# Patient Record
Sex: Male | Born: 1960 | Race: White | Hispanic: No | Marital: Married | Smoking: Never smoker
Health system: Southern US, Community
[De-identification: ages and names within clinical notes are randomized; demographics above are authoritative.]

## PROBLEM LIST (undated history)

## (undated) DIAGNOSIS — K659 Peritonitis, unspecified: Secondary | ICD-10-CM

## (undated) DIAGNOSIS — M549 Dorsalgia, unspecified: Secondary | ICD-10-CM

## (undated) DIAGNOSIS — Q613 Polycystic kidney, unspecified: Secondary | ICD-10-CM

## (undated) DIAGNOSIS — G8929 Other chronic pain: Secondary | ICD-10-CM

## (undated) DIAGNOSIS — N200 Calculus of kidney: Secondary | ICD-10-CM

## (undated) DIAGNOSIS — E785 Hyperlipidemia, unspecified: Secondary | ICD-10-CM

## (undated) DIAGNOSIS — M1710 Unilateral primary osteoarthritis, unspecified knee: Secondary | ICD-10-CM

## (undated) DIAGNOSIS — M51369 Other intervertebral disc degeneration, lumbar region without mention of lumbar back pain or lower extremity pain: Secondary | ICD-10-CM

## (undated) DIAGNOSIS — Z992 Dependence on renal dialysis: Secondary | ICD-10-CM

## (undated) DIAGNOSIS — E119 Type 2 diabetes mellitus without complications: Secondary | ICD-10-CM

## (undated) DIAGNOSIS — M79605 Pain in left leg: Secondary | ICD-10-CM

## (undated) DIAGNOSIS — G4733 Obstructive sleep apnea (adult) (pediatric): Secondary | ICD-10-CM

## (undated) DIAGNOSIS — M79604 Pain in right leg: Secondary | ICD-10-CM

## (undated) DIAGNOSIS — M109 Gout, unspecified: Secondary | ICD-10-CM

## (undated) DIAGNOSIS — N186 End stage renal disease: Secondary | ICD-10-CM

## (undated) DIAGNOSIS — M5136 Other intervertebral disc degeneration, lumbar region: Secondary | ICD-10-CM

## (undated) DIAGNOSIS — G629 Polyneuropathy, unspecified: Secondary | ICD-10-CM

## (undated) DIAGNOSIS — M179 Osteoarthritis of knee, unspecified: Secondary | ICD-10-CM

## (undated) DIAGNOSIS — Z9989 Dependence on other enabling machines and devices: Secondary | ICD-10-CM

---

## 2016-02-09 ENCOUNTER — Ambulatory Visit (HOSPITAL_COMMUNITY)
Admission: RE | Admit: 2016-02-09 | Discharge: 2016-02-09 | Disposition: A | Discharge status: Home / Self Care | Payer: Medicare Other | Source: Ambulatory Visit | Attending: Internal Medicine | Admitting: Internal Medicine

## 2016-02-09 ENCOUNTER — Other Ambulatory Visit (HOSPITAL_COMMUNITY): Payer: Self-pay

## 2016-02-09 ENCOUNTER — Inpatient Hospital Stay
Admission: RE | Admit: 2016-02-09 | Discharge: 2016-02-15 | Disposition: A | Discharge status: Other Acute Inpatient Hospital | Payer: Self-pay | Source: Ambulatory Visit | Attending: Internal Medicine | Admitting: Internal Medicine

## 2016-02-09 ENCOUNTER — Ambulatory Visit (HOSPITAL_COMMUNITY)
Admission: AD | Admit: 2016-02-09 | Payer: Medicare Other | Source: Other Acute Inpatient Hospital | Admitting: Internal Medicine

## 2016-02-09 DIAGNOSIS — Z4659 Encounter for fitting and adjustment of other gastrointestinal appliance and device: Secondary | ICD-10-CM

## 2016-02-09 DIAGNOSIS — J9690 Respiratory failure, unspecified, unspecified whether with hypoxia or hypercapnia: Secondary | ICD-10-CM | POA: Insufficient documentation

## 2016-02-09 LAB — T4, FREE: Free T4: 0.88 ng/dL (ref 0.61–1.12)

## 2016-02-09 LAB — TSH: TSH: 3.039 u[IU]/mL (ref 0.350–4.500)

## 2016-02-10 ENCOUNTER — Encounter (HOSPITAL_BASED_OUTPATIENT_CLINIC_OR_DEPARTMENT_OTHER): Payer: Self-pay

## 2016-02-10 DIAGNOSIS — R609 Edema, unspecified: Secondary | ICD-10-CM

## 2016-02-10 LAB — HEMOGLOBIN A1C
HEMOGLOBIN A1C: 6.8 % — AB (ref 4.8–5.6)
Hgb A1c MFr Bld: 6.7 % — ABNORMAL HIGH (ref 4.8–5.6)
MEAN PLASMA GLUCOSE: 148 mg/dL
Mean Plasma Glucose: 146 mg/dL

## 2016-02-10 LAB — CLOSTRIDIUM DIFFICILE BY PCR: CDIFFPCR: POSITIVE — AB

## 2016-02-10 LAB — COMPREHENSIVE METABOLIC PANEL
ALT: 9 U/L — AB (ref 17–63)
ANION GAP: 8 (ref 5–15)
AST: 33 U/L (ref 15–41)
Albumin: 2 g/dL — ABNORMAL LOW (ref 3.5–5.0)
Alkaline Phosphatase: 77 U/L (ref 38–126)
BUN: 51 mg/dL — ABNORMAL HIGH (ref 6–20)
CHLORIDE: 99 mmol/L — AB (ref 101–111)
CO2: 29 mmol/L (ref 22–32)
Calcium: 9.3 mg/dL (ref 8.9–10.3)
Creatinine, Ser: 5.48 mg/dL — ABNORMAL HIGH (ref 0.61–1.24)
GFR calc non Af Amer: 11 mL/min — ABNORMAL LOW (ref >60)
GFR, EST AFRICAN AMERICAN: 12 mL/min — AB (ref >60)
Glucose, Bld: 126 mg/dL — ABNORMAL HIGH (ref 65–99)
POTASSIUM: 4.1 mmol/L (ref 3.5–5.1)
SODIUM: 136 mmol/L (ref 135–145)
Total Bilirubin: 1.3 mg/dL — ABNORMAL HIGH (ref 0.3–1.2)
Total Protein: 5.4 g/dL — ABNORMAL LOW (ref 6.5–8.1)

## 2016-02-10 LAB — PHOSPHORUS: PHOSPHORUS: 6.5 mg/dL — AB (ref 2.5–4.6)

## 2016-02-10 LAB — T4, FREE: Free T4: 0.93 ng/dL (ref 0.61–1.12)

## 2016-02-10 LAB — CBC WITH DIFFERENTIAL/PLATELET
Basophils Absolute: 0 10*3/uL (ref 0.0–0.1)
Basophils Relative: 0 %
Eosinophils Absolute: 0.2 10*3/uL (ref 0.0–0.7)
Eosinophils Relative: 3 %
HEMATOCRIT: 24.4 % — AB (ref 39.0–52.0)
HEMOGLOBIN: 7.4 g/dL — AB (ref 13.0–17.0)
LYMPHS ABS: 0.6 10*3/uL — AB (ref 0.7–4.0)
Lymphocytes Relative: 9 %
MCH: 26.6 pg (ref 26.0–34.0)
MCHC: 30.3 g/dL (ref 30.0–36.0)
MCV: 87.8 fL (ref 78.0–100.0)
MONOS PCT: 11 %
Monocytes Absolute: 0.8 10*3/uL (ref 0.1–1.0)
NEUTROS ABS: 5.2 10*3/uL (ref 1.7–7.7)
NEUTROS PCT: 77 %
Platelets: 276 10*3/uL (ref 150–400)
RBC: 2.78 MIL/uL — AB (ref 4.22–5.81)
RDW: 15.5 % (ref 11.5–15.5)
WBC: 6.8 10*3/uL (ref 4.0–10.5)

## 2016-02-10 LAB — IRON AND TIBC
Iron: 35 ug/dL — ABNORMAL LOW (ref 45–182)
SATURATION RATIOS: 22 % (ref 17.9–39.5)
TIBC: 158 ug/dL — AB (ref 250–450)
UIBC: 123 ug/dL

## 2016-02-10 LAB — PROTIME-INR
INR: 1.18
Prothrombin Time: 15.1 seconds (ref 11.4–15.2)

## 2016-02-10 LAB — C DIFFICILE QUICK SCREEN W PCR REFLEX
C DIFFICILE (CDIFF) TOXIN: NEGATIVE
C Diff antigen: POSITIVE — AB

## 2016-02-10 LAB — PROCALCITONIN: PROCALCITONIN: 1.13 ng/mL

## 2016-02-10 LAB — TRANSFERRIN: TRANSFERRIN: 113 mg/dL — AB (ref 180–329)

## 2016-02-10 LAB — TSH: TSH: 3.388 u[IU]/mL (ref 0.350–4.500)

## 2016-02-10 LAB — FERRITIN: Ferritin: 599 ng/mL — ABNORMAL HIGH (ref 24–336)

## 2016-02-10 LAB — MAGNESIUM: Magnesium: 2.5 mg/dL — ABNORMAL HIGH (ref 1.7–2.4)

## 2016-02-10 NOTE — Progress Notes (Signed)
*  PRELIMINARY RESULTS* Vascular Ultrasound Right upper extremity venous duplex has been completed.  Preliminary findings: no evidence of DVT or superficial thrombosis. Thickening vs. Partial superficial thrombus noted in the right cephalic vein, which may be related to AVF.    Farrel DemarkJill Eunice, RDMS, RVT  02/10/2016, 10:11 AM

## 2016-02-10 NOTE — Consult Note (Signed)
Date: 02/10/2016                  Patient Name:  KALETH KOY  MRN: [id number]  DOB: 10-05-1960  Age / Sex: 55 y.o., male         PCP: No primary care provider on file.                 Service Requesting Consult: Internal medicine                 Reason for Consult: ESRD, arrange HD            History of Present Illness: Patient is a 55 y.o. male with medical problems of end-stage renal disease started hemodialysis 2015, then switch to peritoneal dialysis in July 2016, diabetes, hyperlipidemia, gouty arthritis, polycystic kidney disease, hypertension, obstructive sleep apnea, chronic back pain from arthritis, who [facility] . patient presented to hospital after a fall at home and hematuria. CT revealed acute subdural hemorrhage without brain compression. His hospital stay was complicated by cardiac arrest, requiring CPR, acute resp failure requiring intubation, aspiration pneumonia. Patient had hip fractures from CPR. Currently he is getting treatment for aspiration pneumonia and MSSA. All information is obtained from the chart as well as from patient wife. Patient is lethargic and unable to provide detailed medical information to Nephrology consult requested for evaluation and management of ESRD   Medications: Outpatient medications: No prescriptions prior to admission.    Current medications: No current facility-administered medications for this encounter.       Allergies: Allergies not on file    Past Medical History: No past medical history on file.   Past Surgical History: No past surgical history on file.  Patient's wife's name is Mykel Sponaugle. He used to be a Lobbyist. Prior to admission patient was independent in activities of daily living. He wasn't to drive. His wife reports he didn't have back pain after walking certain distance.  Family History: No family history on file.   Social  History: Social History   Social History  . Marital status: Married    Spouse name: N/A  . Number of children: N/A  . Years of education: N/A   Occupational History  . Not on file.   Social History Main Topics  . Smoking status: Not on file  . Smokeless tobacco: Not on file  . Alcohol use Not on file  . Drug use: Unknown  . Sexual activity: Not on file   Other Topics Concern  . Not on file   Social History Narrative  . No narrative on file     Review of Systems: not available as patient is lethargic Gen:  HEENT:  CV:  Resp:  GI: GU :  MS:  Derm:   Psych: Heme:  Neuro:  Endocrine  Vital Signs: There were no vitals taken for this visit.   Temperature 98.1, pulse 86, respirations 22, blood pressure 146/80  No intake or output data in the 24 hours ending 02/10/16 1620  Weight trends: There were no vitals filed for this visit.  Physical Exam: General:  No acute distress  HEENT NG tube, moist oral mucous membranes  Neck:  supple  Lungs: Normal breathing effort, scattered rhonchi  Heart::  tachycardic  Abdomen: Soft, nontender, mildly distended, condom catheter, peritoneal dialysis catheter in place  Extremities:  both feet are in soft boot support  Neurologic: Lethargic but arousable, answers simple yes/no  questions  Skin: No acute rashes  Access: Right upper arm AV fistula  Foley: Condom catheter       Lab results: Basic Metabolic Panel:  Recent Labs Lab 02/10/16 0500  NA 136  K 4.1  CL 99*  CO2 29  GLUCOSE 126*  BUN 51*  CREATININE 5.48*  CALCIUM 9.3  MG 2.5*  PHOS 6.5*    Liver Function Tests:  Recent Labs Lab 02/10/16 0500  AST 33  ALT 9*  ALKPHOS 77  BILITOT 1.3*  PROT 5.4*  ALBUMIN 2.0*   No results for input(s): LIPASE, AMYLASE in the last 168 hours. No results for input(s): AMMONIA in the last 168 hours.  CBC:  Recent Labs Lab 02/10/16 0500  WBC 6.8  NEUTROABS 5.2  HGB 7.4*  HCT 24.4*  MCV 87.8  PLT 276     Cardiac Enzymes: No results for input(s): CKTOTAL, TROPONINI in the last 168 hours.  BNP: Invalid input(s): POCBNP  CBG: No results for input(s): GLUCAP in the last 168 hours.  Microbiology: Recent Results (from the past 720 hour(s))  C difficile quick scan w PCR reflex     Status: Abnormal   Collection Time: 02/10/16 10:13 AM  Result Value Ref Range Status   C Diff antigen POSITIVE (A) NEGATIVE Final   C Diff toxin NEGATIVE NEGATIVE Final   C Diff interpretation Results are indeterminate. See PCR results.  Final  Clostridium Difficile by PCR     Status: Abnormal   Collection Time: 02/10/16 10:13 AM  Result Value Ref Range Status   Toxigenic C Difficile by pcr POSITIVE (A) NEGATIVE Final    Comment: Positive for toxigenic C. difficile with little to no toxin production. Only treat if clinical presentation suggests symptomatic illness.     Coagulation Studies:  Recent Labs  02/10/16 0500  LABPROT 15.1  INR 1.18    Urinalysis: No results for input(s): COLORURINE, LABSPEC, PHURINE, GLUCOSEU, HGBUR, BILIRUBINUR, KETONESUR, PROTEINUR, UROBILINOGEN, NITRITE, LEUKOCYTESUR in the last 72 hours.  Invalid input(s): APPERANCEUR      Imaging: Dg Abd 1 View  Result Date: 02/09/2016 CLINICAL DATA:  Nasogastric tube placement. Respiratory failure. Initial encounter. EXAM: ABDOMEN - 1 VIEW COMPARISON:  None. FINDINGS: The patient's enteric tube is noted ending overlying the antrum of the stomach. The stomach is partially filled with air. The visualized bowel gas pattern is unremarkable. Scattered air and stool filled loops of colon are seen; no abnormal dilatation of small bowel loops is seen to suggest small bowel obstruction. No free intra-abdominal air is identified, though evaluation for free air is limited on a single supine view. The visualized osseous structures are within normal limits; the sacroiliac joints are unremarkable in appearance. IMPRESSION: Enteric tube noted  ending overlying the antrum of the stomach. Electronically Signed   By: Roanna Raider M.D.   On: 02/09/2016 17:44   Dg Chest Port 1 View  Result Date: 02/09/2016 CLINICAL DATA:  Nasogastric placement. EXAM: PORTABLE CHEST 1 VIEW COMPARISON:  None. FINDINGS: Nasogastric tube enters the abdomen. Right internal jugular central line has its tip in the SVC at the azygos level. No pneumothorax. There is pulmonary infiltrate at the lung bases right more than left consistent with pneumonia. No effusion. IMPRESSION: Lower lung pneumonia right more than left. Nasogastric tube enters the abdomen. Right IJ central line in the SVC at the azygos level. Electronically Signed   By: Paulina Fusi M.D.   On: 02/09/2016 17:44      Assessment & Plan:  Pt is a 55 y.o. Caucasian male with  medical problems of end-stage renal disease started hemodialysis 2015, then switch to peritoneal dialysis in July 2016, diabetes, hyperlipidemia, gouty arthritis, polycystic kidney disease, hypertension, obstructive sleep apnea, chronic back pain from arthritis, who [facility]   1. End-stage renal disease We will place him on Monday, Wednesday, Friday schedule Dialysis today  2. Anemia of chronic kidney disease Current hemoglobin 7.4 We'll place him on Aranesp protocol Check iron studies   3. Secondary hyperparathyroidism Monitor phosphorus during hospital stay Currently 6.5 We will start him on binders once he is able to take adequate oral intake  4. MSSA infection Antibiotics as per primary team

## 2016-02-11 LAB — RENAL FUNCTION PANEL
ALBUMIN: 2.1 g/dL — AB (ref 3.5–5.0)
Anion gap: 9 (ref 5–15)
BUN: 44 mg/dL — ABNORMAL HIGH (ref 6–20)
CALCIUM: 9 mg/dL (ref 8.9–10.3)
CO2: 29 mmol/L (ref 22–32)
CREATININE: 5.03 mg/dL — AB (ref 0.61–1.24)
Chloride: 97 mmol/L — ABNORMAL LOW (ref 101–111)
GFR calc Af Amer: 14 mL/min — ABNORMAL LOW (ref >60)
GFR calc non Af Amer: 12 mL/min — ABNORMAL LOW (ref >60)
GLUCOSE: 121 mg/dL — AB (ref 65–99)
PHOSPHORUS: 5.7 mg/dL — AB (ref 2.5–4.6)
Potassium: 4.2 mmol/L (ref 3.5–5.1)
SODIUM: 135 mmol/L (ref 135–145)

## 2016-02-11 LAB — CBC WITH DIFFERENTIAL/PLATELET
BASOS ABS: 0 10*3/uL (ref 0.0–0.1)
BASOS PCT: 0 %
EOS ABS: 0.2 10*3/uL (ref 0.0–0.7)
Eosinophils Relative: 4 %
HEMATOCRIT: 24 % — AB (ref 39.0–52.0)
HEMOGLOBIN: 7.5 g/dL — AB (ref 13.0–17.0)
Lymphocytes Relative: 13 %
Lymphs Abs: 0.9 10*3/uL (ref 0.7–4.0)
MCH: 27.4 pg (ref 26.0–34.0)
MCHC: 31.3 g/dL (ref 30.0–36.0)
MCV: 87.6 fL (ref 78.0–100.0)
MONO ABS: 0.5 10*3/uL (ref 0.1–1.0)
MONOS PCT: 7 %
NEUTROS ABS: 5.1 10*3/uL (ref 1.7–7.7)
NEUTROS PCT: 76 %
Platelets: 274 10*3/uL (ref 150–400)
RBC: 2.74 MIL/uL — ABNORMAL LOW (ref 4.22–5.81)
RDW: 15.5 % (ref 11.5–15.5)
WBC: 6.6 10*3/uL (ref 4.0–10.5)

## 2016-02-11 LAB — HEPATITIS B CORE ANTIBODY, TOTAL: Hep B Core Total Ab: NEGATIVE

## 2016-02-11 LAB — HEPATITIS B SURFACE ANTIGEN: HEP B S AG: NEGATIVE

## 2016-02-11 LAB — PTH, INTACT AND CALCIUM
Calcium, Total (PTH): 8.9 mg/dL (ref 8.7–10.2)
PTH: 134 pg/mL — AB (ref 15–65)

## 2016-02-11 LAB — HEPATITIS B SURFACE ANTIBODY,QUALITATIVE: Hep B S Ab: NONREACTIVE

## 2016-02-11 LAB — MAGNESIUM: MAGNESIUM: 2.4 mg/dL (ref 1.7–2.4)

## 2016-02-12 LAB — RENAL FUNCTION PANEL
ALBUMIN: 2 g/dL — AB (ref 3.5–5.0)
Anion gap: 11 (ref 5–15)
BUN: 63 mg/dL — AB (ref 6–20)
CO2: 29 mmol/L (ref 22–32)
Calcium: 9.5 mg/dL (ref 8.9–10.3)
Chloride: 99 mmol/L — ABNORMAL LOW (ref 101–111)
Creatinine, Ser: 6.98 mg/dL — ABNORMAL HIGH (ref 0.61–1.24)
GFR calc Af Amer: 9 mL/min — ABNORMAL LOW (ref >60)
GFR calc non Af Amer: 8 mL/min — ABNORMAL LOW (ref >60)
GLUCOSE: 58 mg/dL — AB (ref 65–99)
PHOSPHORUS: 8.2 mg/dL — AB (ref 2.5–4.6)
POTASSIUM: 4.8 mmol/L (ref 3.5–5.1)
Sodium: 139 mmol/L (ref 135–145)

## 2016-02-12 LAB — CBC WITH DIFFERENTIAL/PLATELET
BASOS ABS: 0 10*3/uL (ref 0.0–0.1)
BASOS PCT: 0 %
EOS ABS: 0.3 10*3/uL (ref 0.0–0.7)
EOS PCT: 4 %
HCT: 24.1 % — ABNORMAL LOW (ref 39.0–52.0)
Hemoglobin: 7.2 g/dL — ABNORMAL LOW (ref 13.0–17.0)
Lymphocytes Relative: 11 %
Lymphs Abs: 0.8 10*3/uL (ref 0.7–4.0)
MCH: 26.6 pg (ref 26.0–34.0)
MCHC: 29.9 g/dL — ABNORMAL LOW (ref 30.0–36.0)
MCV: 88.9 fL (ref 78.0–100.0)
MONO ABS: 0.7 10*3/uL (ref 0.1–1.0)
Monocytes Relative: 10 %
Neutro Abs: 5.5 10*3/uL (ref 1.7–7.7)
Neutrophils Relative %: 75 %
PLATELETS: 300 10*3/uL (ref 150–400)
RBC: 2.71 MIL/uL — ABNORMAL LOW (ref 4.22–5.81)
RDW: 15.7 % — AB (ref 11.5–15.5)
WBC: 7.3 10*3/uL (ref 4.0–10.5)

## 2016-02-12 LAB — MAGNESIUM: MAGNESIUM: 2.6 mg/dL — AB (ref 1.7–2.4)

## 2016-02-13 LAB — CBC
HCT: 24.1 % — ABNORMAL LOW (ref 39.0–52.0)
HEMOGLOBIN: 7.3 g/dL — AB (ref 13.0–17.0)
MCH: 26.8 pg (ref 26.0–34.0)
MCHC: 30.3 g/dL (ref 30.0–36.0)
MCV: 88.6 fL (ref 78.0–100.0)
Platelets: 305 10*3/uL (ref 150–400)
RBC: 2.72 MIL/uL — ABNORMAL LOW (ref 4.22–5.81)
RDW: 15.8 % — AB (ref 11.5–15.5)
WBC: 8 10*3/uL (ref 4.0–10.5)

## 2016-02-13 LAB — RENAL FUNCTION PANEL
ALBUMIN: 2.1 g/dL — AB (ref 3.5–5.0)
ANION GAP: 14 (ref 5–15)
BUN: 80 mg/dL — AB (ref 6–20)
CALCIUM: 9.3 mg/dL (ref 8.9–10.3)
CO2: 26 mmol/L (ref 22–32)
Chloride: 96 mmol/L — ABNORMAL LOW (ref 101–111)
Creatinine, Ser: 8.59 mg/dL — ABNORMAL HIGH (ref 0.61–1.24)
GFR calc Af Amer: 7 mL/min — ABNORMAL LOW (ref >60)
GFR, EST NON AFRICAN AMERICAN: 6 mL/min — AB (ref >60)
GLUCOSE: 135 mg/dL — AB (ref 65–99)
PHOSPHORUS: 10.5 mg/dL — AB (ref 2.5–4.6)
Potassium: 6.1 mmol/L — ABNORMAL HIGH (ref 3.5–5.1)
SODIUM: 136 mmol/L (ref 135–145)

## 2016-02-13 LAB — POTASSIUM: Potassium: 4 mmol/L (ref 3.5–5.1)

## 2016-02-13 NOTE — Progress Notes (Signed)
Subjective:   Patient seen during dialysis Tolerating well  + C Diff - stool Phos and potassium are high V8 juice in the room.  Low grade fever- blood cultures to be drawn during dialysis   Objective:  Vital signs in last 24 hours:  BP: ()/()  Arterial Line BP: ()/()   Weight change:  There were no vitals filed for this visit.  Intake/Output:   No intake or output data in the 24 hours ending 02/13/16 0959   Physical Exam: General: NAD, laying in the bed  HEENT Anicteric  Neck supple  Pulm/lungs Clear b/l  CVS/Heart No rub  Abdomen:  Soft, mild tenderness, PD cathter in place  Extremities: No peripheral edema  Neurologic: Alert, oriented  Skin: No acute rashes  Access: Rt upper arm AVF       Basic Metabolic Panel:   Recent Labs Lab 02/10/16 0500  02/11/16 0600 02/12/16 0637 02/13/16 0500  NA 136  --  135 139 136  K 4.1  --  4.2 4.8 6.1*  CL 99*  --  97* 99* 96*  CO2 29  --  29 29 26   GLUCOSE 126*  --  121* 58* 135*  BUN 51*  --  44* 63* 80*  CREATININE 5.48*  --  5.03* 6.98* 8.59*  CALCIUM 9.3  < > 9.0 9.5 9.3  MG 2.5*  --  2.4 2.6*  --   PHOS 6.5*  --  5.7* 8.2* 10.5*  < > = values in this interval not displayed.   CBC:  Recent Labs Lab 02/10/16 0500 02/11/16 0600 02/12/16 0637 02/13/16 0500  WBC 6.8 6.6 7.3 8.0  NEUTROABS 5.2 5.1 5.5  --   HGB 7.4* 7.5* 7.2* 7.3*  HCT 24.4* 24.0* 24.1* 24.1*  MCV 87.8 87.6 88.9 88.6  PLT 276 274 300 305      Microbiology:  Recent Results (from the past 720 hour(s))  C difficile quick scan w PCR reflex     Status: Abnormal   Collection Time: 02/10/16 10:13 AM  Result Value Ref Range Status   C Diff antigen POSITIVE (A) NEGATIVE Final   C Diff toxin NEGATIVE NEGATIVE Final   C Diff interpretation Results are indeterminate. See PCR results.  Final  Clostridium Difficile by PCR     Status: Abnormal   Collection Time: 02/10/16 10:13 AM  Result Value Ref Range Status   Toxigenic C Difficile by pcr  POSITIVE (A) NEGATIVE Final    Comment: Positive for toxigenic C. difficile with little to no toxin production. Only treat if clinical presentation suggests symptomatic illness.    Coagulation Studies: No results for input(s): LABPROT, INR in the last 72 hours.  Urinalysis: No results for input(s): COLORURINE, LABSPEC, PHURINE, GLUCOSEU, HGBUR, BILIRUBINUR, KETONESUR, PROTEINUR, UROBILINOGEN, NITRITE, LEUKOCYTESUR in the last 72 hours.  Invalid input(s): APPERANCEUR    Imaging: No results found.   Medications:       Assessment/ Plan:  55 y.o. caucasian male with  medical problems of end-stage renal disease started hemodialysis 2015, then switch to peritoneal dialysis in July 2016, diabetes, hyperlipidemia, gouty arthritis, polycystic kidney disease, hypertension, obstructive sleep apnea, chronic back pain from arthritis, who [facility]   1. End-stage renal disease We will place him on Monday, Wednesday, Friday schedule Dialysis today Patient seen during dialysis Tolerating well   2. Anemia of chronic kidney disease Current hemoglobin 7.3 T sat 22% Continue Aranesp   3. Secondary hyperparathyroidism Monitor phosphorus  during hospital stay Currently 10.5 Continue renvela  4. MSSA infection Antibiotics as per primary team  5. C Diff positive ABx per IM team   LOS: 0 Wednesday Ericsson 10/23/20179:59 AM

## 2016-02-14 LAB — RENAL FUNCTION PANEL
ALBUMIN: 1.9 g/dL — AB (ref 3.5–5.0)
ANION GAP: 10 (ref 5–15)
BUN: 51 mg/dL — ABNORMAL HIGH (ref 6–20)
CALCIUM: 8.9 mg/dL (ref 8.9–10.3)
CO2: 29 mmol/L (ref 22–32)
Chloride: 97 mmol/L — ABNORMAL LOW (ref 101–111)
Creatinine, Ser: 6.6 mg/dL — ABNORMAL HIGH (ref 0.61–1.24)
GFR calc non Af Amer: 8 mL/min — ABNORMAL LOW (ref >60)
GFR, EST AFRICAN AMERICAN: 10 mL/min — AB (ref >60)
Glucose, Bld: 62 mg/dL — ABNORMAL LOW (ref 65–99)
PHOSPHORUS: 7.9 mg/dL — AB (ref 2.5–4.6)
Potassium: 5.2 mmol/L — ABNORMAL HIGH (ref 3.5–5.1)
SODIUM: 136 mmol/L (ref 135–145)

## 2016-02-14 LAB — PREPARE RBC (CROSSMATCH)

## 2016-02-14 LAB — CBC WITH DIFFERENTIAL/PLATELET
BASOS ABS: 0 10*3/uL (ref 0.0–0.1)
BASOS PCT: 0 %
Eosinophils Absolute: 0.2 10*3/uL (ref 0.0–0.7)
Eosinophils Relative: 3 %
HEMATOCRIT: 21.4 % — AB (ref 39.0–52.0)
HEMOGLOBIN: 6.5 g/dL — AB (ref 13.0–17.0)
Lymphocytes Relative: 13 %
Lymphs Abs: 0.7 10*3/uL (ref 0.7–4.0)
MCH: 27.4 pg (ref 26.0–34.0)
MCHC: 30.4 g/dL (ref 30.0–36.0)
MCV: 90.3 fL (ref 78.0–100.0)
Monocytes Absolute: 0.6 10*3/uL (ref 0.1–1.0)
Monocytes Relative: 10 %
NEUTROS ABS: 4.2 10*3/uL (ref 1.7–7.7)
NEUTROS PCT: 74 %
Platelets: 253 10*3/uL (ref 150–400)
RBC: 2.37 MIL/uL — ABNORMAL LOW (ref 4.22–5.81)
RDW: 16.1 % — ABNORMAL HIGH (ref 11.5–15.5)
WBC: 5.6 10*3/uL (ref 4.0–10.5)

## 2016-02-14 LAB — ABO/RH: ABO/RH(D): O POS

## 2016-02-14 LAB — MAGNESIUM: Magnesium: 2.3 mg/dL (ref 1.7–2.4)

## 2016-02-14 MED FILL — Fentanyl Citrate Preservative Free (PF) Inj 100 MCG/2ML: INTRAMUSCULAR | Qty: 2 | Status: AC

## 2016-02-15 ENCOUNTER — Inpatient Hospital Stay
Admission: RE | Admit: 2016-02-15 | Discharge: 2016-02-29 | Disposition: A | Discharge status: Rehabilitation Facility | Payer: Medicare Other | Source: Ambulatory Visit | Attending: Internal Medicine | Admitting: Internal Medicine

## 2016-02-15 ENCOUNTER — Encounter
Admission: RE | Disposition: A | Discharge status: Other Acute Inpatient Hospital | Payer: Self-pay | Source: Ambulatory Visit | Attending: Internal Medicine

## 2016-02-15 ENCOUNTER — Ambulatory Visit (HOSPITAL_COMMUNITY)
Admission: RE | Admit: 2016-02-15 | Discharge: 2016-02-15 | Disposition: A | Discharge status: Home / Self Care | Payer: No Typology Code available for payment source | Source: Ambulatory Visit | Attending: Vascular Surgery | Admitting: Vascular Surgery

## 2016-02-15 ENCOUNTER — Encounter (HOSPITAL_COMMUNITY)
Admission: RE | Disposition: A | Discharge status: Other Acute Inpatient Hospital | Payer: Self-pay | Source: Ambulatory Visit | Attending: Vascular Surgery

## 2016-02-15 ENCOUNTER — Encounter (HOSPITAL_COMMUNITY): Payer: Self-pay | Admitting: Vascular Surgery

## 2016-02-15 DIAGNOSIS — N186 End stage renal disease: Secondary | ICD-10-CM | POA: Insufficient documentation

## 2016-02-15 DIAGNOSIS — T82858A Stenosis of vascular prosthetic devices, implants and grafts, initial encounter: Secondary | ICD-10-CM | POA: Insufficient documentation

## 2016-02-15 DIAGNOSIS — Z91041 Radiographic dye allergy status: Secondary | ICD-10-CM | POA: Insufficient documentation

## 2016-02-15 DIAGNOSIS — Y832 Surgical operation with anastomosis, bypass or graft as the cause of abnormal reaction of the patient, or of later complication, without mention of misadventure at the time of the procedure: Secondary | ICD-10-CM | POA: Insufficient documentation

## 2016-02-15 DIAGNOSIS — T82898A Other specified complication of vascular prosthetic devices, implants and grafts, initial encounter: Secondary | ICD-10-CM | POA: Diagnosis not present

## 2016-02-15 DIAGNOSIS — Z992 Dependence on renal dialysis: Secondary | ICD-10-CM | POA: Insufficient documentation

## 2016-02-15 HISTORY — DX: End stage renal disease: N18.6

## 2016-02-15 HISTORY — DX: Osteoarthritis of knee, unspecified: M17.9

## 2016-02-15 HISTORY — DX: Calculus of kidney: N20.0

## 2016-02-15 HISTORY — DX: Type 2 diabetes mellitus without complications: E11.9

## 2016-02-15 HISTORY — DX: Pain in left leg: M79.605

## 2016-02-15 HISTORY — DX: Polyneuropathy, unspecified: G62.9

## 2016-02-15 HISTORY — DX: Other intervertebral disc degeneration, lumbar region without mention of lumbar back pain or lower extremity pain: M51.369

## 2016-02-15 HISTORY — DX: Obstructive sleep apnea (adult) (pediatric): G47.33

## 2016-02-15 HISTORY — DX: Gout, unspecified: M10.9

## 2016-02-15 HISTORY — DX: Peritonitis, unspecified: K65.9

## 2016-02-15 HISTORY — DX: Dependence on renal dialysis: Z99.2

## 2016-02-15 HISTORY — DX: Other intervertebral disc degeneration, lumbar region: M51.36

## 2016-02-15 HISTORY — DX: Pain in right leg: M79.604

## 2016-02-15 HISTORY — DX: Dorsalgia, unspecified: M54.9

## 2016-02-15 HISTORY — DX: Polycystic kidney, unspecified: Q61.3

## 2016-02-15 HISTORY — DX: Hyperlipidemia, unspecified: E78.5

## 2016-02-15 HISTORY — DX: Other chronic pain: G89.29

## 2016-02-15 HISTORY — PX: PERIPHERAL VASCULAR CATHETERIZATION: SHX172C

## 2016-02-15 HISTORY — DX: Dependence on other enabling machines and devices: Z99.89

## 2016-02-15 HISTORY — DX: Unilateral primary osteoarthritis, unspecified knee: M17.10

## 2016-02-15 LAB — CBC WITH DIFFERENTIAL/PLATELET
BASOS ABS: 0 10*3/uL (ref 0.0–0.1)
Basophils Relative: 0 %
EOS PCT: 3 %
Eosinophils Absolute: 0.2 10*3/uL (ref 0.0–0.7)
HEMATOCRIT: 43.6 % (ref 39.0–52.0)
Hemoglobin: 13.4 g/dL (ref 13.0–17.0)
LYMPHS ABS: 0.7 10*3/uL (ref 0.7–4.0)
LYMPHS PCT: 8 %
MCH: 26.6 pg (ref 26.0–34.0)
MCHC: 30.7 g/dL (ref 30.0–36.0)
MCV: 86.7 fL (ref 78.0–100.0)
Monocytes Absolute: 0.7 10*3/uL (ref 0.1–1.0)
Monocytes Relative: 9 %
NEUTROS ABS: 6.4 10*3/uL (ref 1.7–7.7)
Neutrophils Relative %: 79 %
PLATELETS: 173 10*3/uL (ref 150–400)
RBC: 5.03 MIL/uL (ref 4.22–5.81)
RDW: 16.9 % — ABNORMAL HIGH (ref 11.5–15.5)
WBC: 8.1 10*3/uL (ref 4.0–10.5)

## 2016-02-15 LAB — OCCULT BLOOD X 1 CARD TO LAB, STOOL: Fecal Occult Bld: POSITIVE — AB

## 2016-02-15 LAB — RENAL FUNCTION PANEL
ANION GAP: 13 (ref 5–15)
Albumin: 2.1 g/dL — ABNORMAL LOW (ref 3.5–5.0)
BUN: 70 mg/dL — ABNORMAL HIGH (ref 6–20)
CHLORIDE: 95 mmol/L — AB (ref 101–111)
CO2: 27 mmol/L (ref 22–32)
CREATININE: 8.49 mg/dL — AB (ref 0.61–1.24)
Calcium: 9.3 mg/dL (ref 8.9–10.3)
GFR, EST AFRICAN AMERICAN: 7 mL/min — AB (ref >60)
GFR, EST NON AFRICAN AMERICAN: 6 mL/min — AB (ref >60)
Glucose, Bld: 109 mg/dL — ABNORMAL HIGH (ref 65–99)
POTASSIUM: 5.8 mmol/L — AB (ref 3.5–5.1)
Phosphorus: 9.9 mg/dL — ABNORMAL HIGH (ref 2.5–4.6)
Sodium: 135 mmol/L (ref 135–145)

## 2016-02-15 LAB — TYPE AND SCREEN
ABO/RH(D): O POS
Antibody Screen: NEGATIVE
UNIT DIVISION: 0

## 2016-02-15 LAB — MAGNESIUM: MAGNESIUM: 2.7 mg/dL — AB (ref 1.7–2.4)

## 2016-02-15 SURGERY — A/V SHUNTOGRAM/FISTULAGRAM

## 2016-02-15 SURGERY — A/V SHUNTOGRAM/FISTULAGRAM
Laterality: Right

## 2016-02-15 MED ORDER — FAMOTIDINE IN NACL 20-0.9 MG/50ML-% IV SOLN
INTRAVENOUS | Status: AC
  Filled 2016-02-15: qty 50

## 2016-02-15 MED ORDER — LIDOCAINE HCL (PF) 1 % IJ SOLN
INTRAMUSCULAR | Status: DC | PRN
  Administered 2016-02-15: 7 mL

## 2016-02-15 MED ORDER — FAMOTIDINE IN NACL 20-0.9 MG/50ML-% IV SOLN
INTRAVENOUS | Status: DC | PRN
  Administered 2016-02-15: 20 mg via INTRAVENOUS

## 2016-02-15 MED ORDER — LIDOCAINE HCL (PF) 1 % IJ SOLN
INTRAMUSCULAR | Status: AC
  Filled 2016-02-15: qty 30

## 2016-02-15 MED ORDER — METHYLPREDNISOLONE SODIUM SUCC 125 MG IJ SOLR
INTRAMUSCULAR | Status: AC
  Filled 2016-02-15: qty 2

## 2016-02-15 MED ORDER — IODIXANOL 320 MG/ML IV SOLN
INTRAVENOUS | Status: DC | PRN
  Administered 2016-02-15: 85 mL via INTRAVENOUS

## 2016-02-15 MED ORDER — DIPHENHYDRAMINE HCL 50 MG/ML IJ SOLN
INTRAMUSCULAR | Status: DC | PRN
  Administered 2016-02-15: 25 mg via INTRAVENOUS

## 2016-02-15 MED ORDER — METHYLPREDNISOLONE SODIUM SUCC 125 MG IJ SOLR
INTRAMUSCULAR | Status: DC | PRN
  Administered 2016-02-15: 125 mg via INTRAVENOUS

## 2016-02-15 MED ORDER — DIPHENHYDRAMINE HCL 50 MG/ML IJ SOLN
INTRAMUSCULAR | Status: AC
  Filled 2016-02-15: qty 1

## 2016-02-15 MED ORDER — HEPARIN (PORCINE) IN NACL 2-0.9 UNIT/ML-% IJ SOLN
INTRAMUSCULAR | Status: AC
Start: 2016-02-15 — End: 2016-02-15
  Filled 2016-02-15: qty 500

## 2016-02-15 MED ORDER — HEPARIN (PORCINE) IN NACL 2-0.9 UNIT/ML-% IJ SOLN
INTRAMUSCULAR | Status: DC | PRN
  Administered 2016-02-15: 500 mL

## 2016-02-15 SURGICAL SUPPLY — 22 items
BAG SNAP BAND KOVER 36X36 (MISCELLANEOUS) ×2 IMPLANT
BALLN ARMADA 14X40X80 (BALLOONS) ×2
BALLN MUSTANG 12.0X40 75 (BALLOONS) ×2
BALLN MUSTANG 8X80X75 (BALLOONS) ×2
BALLN MUSTANG 9X80X75 (BALLOONS) ×2
BALLOON ARMADA 14X40X80 (BALLOONS) ×1 IMPLANT
BALLOON MUSTANG 12.0X40 75 (BALLOONS) ×1 IMPLANT
BALLOON MUSTANG 8X80X75 (BALLOONS) ×1 IMPLANT
BALLOON MUSTANG 9X80X75 (BALLOONS) ×1 IMPLANT
CATH ANGIO 5F BER2 65CM (CATHETERS) ×2 IMPLANT
COVER DOME SNAP 22 D (MISCELLANEOUS) ×2 IMPLANT
COVER PRB 48X5XTLSCP FOLD TPE (BAG) ×1 IMPLANT
COVER PROBE 5X48 (BAG) ×1
KIT ENCORE 26 ADVANTAGE (KITS) ×2 IMPLANT
KIT MICROINTRODUCER STIFF 5F (SHEATH) ×2 IMPLANT
PROTECTION STATION PRESSURIZED (MISCELLANEOUS) ×2
SHEATH PINNACLE R/O II 7F 4CM (SHEATH) ×2 IMPLANT
STATION PROTECTION PRESSURIZED (MISCELLANEOUS) ×1 IMPLANT
STOPCOCK MORSE 400PSI 3WAY (MISCELLANEOUS) ×2 IMPLANT
TRAY PV CATH (CUSTOM PROCEDURE TRAY) ×2 IMPLANT
TUBING CIL FLEX 10 FLL-RA (TUBING) ×2 IMPLANT
WIRE BENTSON .035X145CM (WIRE) ×2 IMPLANT

## 2016-02-15 NOTE — Progress Notes (Signed)
Subjective:    patient underwent hemodialysis this morning but it could not be completed due to access malfunction. Patient had clots in his access. Therefore, vascular surgery/radiology were consulted for evaluation. He subsequently underwent venogram of the right upper extremity access. Central stenosis was noted. Flow through access improved with angioplasty. Potassium 5.8 this morning Phosphorus 9.9 Hemoglobin 13.4, which is a significant jump from 6.5 yesterday. Patient did receive one unit of blood transfusion yesterday   Objective:  Vital signs in last 24 hours:  Pulse Rate:  [0-165] 0 (10/25 1414) Resp:  [4-32] 4 (10/25 1414) BP: (118-136)/(72-83) 122/73 (10/25 1414) SpO2:  [0 %-98 %] 0 % (10/25 1414)  Weight change:  There were no vitals filed for this visit.  Intake/Output:   No intake or output data in the 24 hours ending 02/15/16 1759   Physical Exam: General: NAD, laying in the bed  HEENT Anicteric  Neck supple  Pulm/lungs Clear b/l  CVS/Heart No rub  Abdomen:  Soft, mild tenderness, PD cathter in place  Extremities: Trace peripheral edema  Neurologic: Alert, oriented  Skin: No acute rashes  Access: Rt upper arm AVF       Basic Metabolic Panel:   Recent Labs Lab 02/10/16 0500  02/11/16 0600 02/12/16 0637 02/13/16 0500 02/13/16 1127 02/14/16 0619 02/15/16 0900  NA 136  --  135 139 136  --  136 135  K 4.1  --  4.2 4.8 6.1* 4.0 5.2* 5.8*  CL 99*  --  97* 99* 96*  --  97* 95*  CO2 29  --  29 29 26   --  29 27  GLUCOSE 126*  --  121* 58* 135*  --  62* 109*  BUN 51*  --  44* 63* 80*  --  51* 70*  CREATININE 5.48*  --  5.03* 6.98* 8.59*  --  6.60* 8.49*  CALCIUM 9.3  < > 9.0 9.5 9.3  --  8.9 9.3  MG 2.5*  --  2.4 2.6*  --   --  2.3 2.7*  PHOS 6.5*  --  5.7* 8.2* 10.5*  --  7.9* 9.9*  < > = values in this interval not displayed.   CBC:  Recent Labs Lab 02/10/16 0500 02/11/16 0600 02/12/16 0637 02/13/16 0500 02/14/16 0619 02/15/16 0900  WBC  6.8 6.6 7.3 8.0 5.6 8.1  NEUTROABS 5.2 5.1 5.5  --  4.2 6.4  HGB 7.4* 7.5* 7.2* 7.3* 6.5* 13.4  HCT 24.4* 24.0* 24.1* 24.1* 21.4* 43.6  MCV 87.8 87.6 88.9 88.6 90.3 86.7  PLT 276 274 300 305 253 173      Microbiology:  Recent Results (from the past 720 hour(s))  C difficile quick scan w PCR reflex     Status: Abnormal   Collection Time: 02/10/16 10:13 AM  Result Value Ref Range Status   C Diff antigen POSITIVE (A) NEGATIVE Final   C Diff toxin NEGATIVE NEGATIVE Final   C Diff interpretation Results are indeterminate. See PCR results.  Final  Clostridium Difficile by PCR     Status: Abnormal   Collection Time: 02/10/16 10:13 AM  Result Value Ref Range Status   Toxigenic C Difficile by pcr POSITIVE (A) NEGATIVE Final    Comment: Positive for toxigenic C. difficile with little to no toxin production. Only treat if clinical presentation suggests symptomatic illness.  Culture, blood (single)     Status: None (Preliminary result)   Collection Time: 02/13/16  9:30 AM  Result Value Ref Range  Status   Specimen Description BLOOD  RIGHT FISTULA  Final   Special Requests BOTTLES DRAWN AEROBIC AND ANAEROBIC  10CC  Final   Culture NO GROWTH 2 DAYS  Final   Report Status PENDING  Incomplete    Coagulation Studies: No results for input(s): LABPROT, INR in the last 72 hours.  Urinalysis: No results for input(s): COLORURINE, LABSPEC, PHURINE, GLUCOSEU, HGBUR, BILIRUBINUR, KETONESUR, PROTEINUR, UROBILINOGEN, NITRITE, LEUKOCYTESUR in the last 72 hours.  Invalid input(s): APPERANCEUR    Imaging: No results found.   Medications:       Assessment/ Plan:  55 y.o. caucasian male with  medical problems of end-stage renal disease started hemodialysis 2015, then switch to peritoneal dialysis in July 2016, diabetes, hyperlipidemia, gouty arthritis, polycystic kidney disease, hypertension, obstructive sleep apnea, chronic back pain from arthritis, who [facility]   1. End-stage renal disease We will place him on Monday, Wednesday, Friday schedule Dialysis tomorrow (make up treatment) and Friday    2. Anemia of chronic kidney disease Current hemoglobin 6.5 T sat 22% Continue Aranesp Blood transfusion given 10/24   3. Secondary hyperparathyroidism Monitor phosphorus during hospital stay Currently 9.9 Continue renvela - 1600 3 times a day with meals, Sensipar 60 mg daily  4. MSSA infection Antibiotics as per primary team  5. C Diff positive ABx per IM team  6 .Hyperkelamia - Dialysis tomorrow morning    LOS: 0 [name] [name] 10/25/20175:59 PM

## 2016-02-15 NOTE — Discharge Instructions (Signed)
Fistulogram, Care After °Refer to this sheet in the next few weeks. These instructions provide you with information on caring for yourself after your procedure. Your health care provider may also give you more specific instructions. Your treatment has been planned according to current medical practices, but problems sometimes occur. Call your health care provider if you have any problems or questions after your procedure. °WHAT TO EXPECT AFTER THE PROCEDURE °After your procedure, it is typical to have the following: °· A small amount of discomfort in the area where the catheters were placed. °· A small amount of bruising around the fistula. °· Sleepiness and fatigue. °HOME CARE INSTRUCTIONS °· Rest at home for the day following your procedure. °· Do not drive or operate heavy machinery while taking pain medicine. °· Take medicines only as directed by your health care provider. °· Do not take baths, swim, or use a hot tub until your health care provider approves. You may shower 24 hours after the procedure or as directed by your health care provider. °· There are many different ways to close and cover an incision, including stitches, skin glue, and adhesive strips. Follow your health care provider's instructions on: °¨ Incision care. °¨ Bandage (dressing) changes and removal. °¨ Incision closure removal. °· Monitor your dialysis fistula carefully. °SEEK MEDICAL CARE IF: °· You have drainage, redness, swelling, or pain at your catheter site. °· You have a fever. °· You have chills. °SEEK IMMEDIATE MEDICAL CARE IF: °· You feel weak. °· You have trouble balancing. °· You have trouble moving your arms or legs. °· You have problems with your speech or vision. °· You can no longer feel a vibration or buzz when you put your fingers over your dialysis fistula. °· The limb that was used for the procedure: °¨ Swells. °¨ Is painful. °¨ Is cold. °¨ Is discolored, such as blue or pale white. °  °This information is not intended  to replace advice given to you by your health care provider. Make sure you discuss any questions you have with your health care provider. °  °Document Released: 08/24/2013 Document Reviewed: 08/24/2013 °Elsevier Interactive Patient Education ©2016 [employer] ° °

## 2016-02-15 NOTE — Op Note (Signed)
    Patient name: [name] BasemanLonnie L Picazo MRN: [id number] DOB: Jun 08, 1960 Sex: male  02/15/2016 Pre-operative Diagnosis: ESRD, malfunctioning avf on right Post-operative diagnosis:  Same Surgeon:  Luanna SalkBrandon C. Randie Heinzain, MD Procedure Performed: 1.  US guided cannulation of right arm avf 2.  Venogram right upper extremity 3.  Balloon angioplasty right innominate with 14mm  4.  Balloon angioplasty right cephalic arch with 8mm  Indications:  55 year old male following intracranial hemorrhage and CPR with end-stage renal disease on dialysis via right upper extremity AV fistula. He now has acute swelling of his right upper extremity difficulty cannulating on dialysis and is indicated for the above procedure.  Findings: There are several large collaterals fistula in the right arm. Centrally there was a notable stenosis with evident collaterals proximal to that. Following balloon angioplasty with 14 mm balloon centrally and 8 mm balloon at the cephalic arch there was improved thrill in the on although residual collaterals existed.   Procedure:  The patient was identified in the holding area and taken to room 8.  He was sterilely prepped and draped in the usual fashion timeout called he was premedicated for contrast allergy. We began with ultrasound-guided cannulation of the right arm AV fistula. This was identified and noted to be patent under ultrasound and cannulated with thrill gauge needle. We then placed micro puncture wire micro puncture sheath and performed lower extremity venogram with the above findings. We then exchanged for 7 French sheath and using BER catheter were able to cross centrally. We then ballooned sequentially with 9 mm for millimeter and 14 mm balloons. We had residual stenosis of his coccygeal wound with 8 mm balloon. Following this there was improved thrill in the fistula although remained collaterals and arm feel large branches and collateral centrally as well. Wire and catheter were removed  for Monocryl was used to suture the puncture site 7 French sheath removed as well. Patient was wrapped with Ace bandage.   Fistula is okay for use.  No sedation was given.   Ahijah Devery C. Randie Heinzain, MD [facility] Office: [phone] Pager: [phone]

## 2016-02-15 NOTE — H&P (Signed)
  Hospital Consult    Reason for Consult:  Malfunctioning RUE avf [facility] Physician:  [facility] MRN #:  [id number]  History of Present Illness: This is a 55 y.o. [facility]. Has 10months of Right arm avf but does PD as outpatient. Difficulty cannulating avf today with return of clots. Also complains of edema in RUE for a few days. Denies history of intervention on avf.   No past medical history on file.  No past surgical history on file.  Allergies not on file  Prior to Admission medications   Not on File    Social History        Social History  . Marital status: Married    Spouse name: N/A  . Number of children: N/A  . Years of education: N/A      Occupational History  . Not on file.       Social History Main Topics  . Smoking status: Not on file  . Smokeless tobacco: Not on file  . Alcohol use Not on file  . Drug use: Unknown  . Sexual activity: Not on file       Other Topics Concern  . Not on file      Social History Narrative  . No narrative on file      Physical Examination  There were no vitals filed for this visit. There is no height or weight on file to calculate BMI.  General:  WDWN in NAD Gait: Not observed HENT: WNL, normocephalic Pulmonary: normal non-labored breathing, without Rales, rhonchi,  wheezing Cardiac: palpable R radial pulse Abdomen:  soft, NT/ND, no masses Musculoskeletal: right arm edema, pulsatility in [place] avf Neurologic: A&O X 3; Appropriate Affect ; SENSATION: normal; MOTOR FUNCTION:  moving all extremities equally. Speech is fluent/normal   CBC Labs (Brief)          Component Value Date/Time   WBC 8.1 02/15/2016 0900   RBC 5.03 02/15/2016 0900   HGB 13.4 02/15/2016 0900   HCT 43.6 02/15/2016 0900   PLT 173 02/15/2016 0900   MCV 86.7 02/15/2016 0900   MCH 26.6 02/15/2016 0900   MCHC 30.7 02/15/2016 0900   RDW 16.9 (H) 02/15/2016 0900   LYMPHSABS 0.7  02/15/2016 0900   MONOABS 0.7 02/15/2016 0900   EOSABS 0.2 02/15/2016 0900   BASOSABS 0.0 02/15/2016 0900      BMET Labs (Brief)          Component Value Date/Time   NA 135 02/15/2016 0900   K 5.8 (H) 02/15/2016 0900   CL 95 (L) 02/15/2016 0900   CO2 27 02/15/2016 0900   GLUCOSE 109 (H) 02/15/2016 0900   BUN 70 (H) 02/15/2016 0900   CREATININE 8.49 (H) 02/15/2016 0900   CALCIUM 9.3 02/15/2016 0900   CALCIUM 8.9 02/10/2016 1730   GFRNONAA 6 (L) 02/15/2016 0900   GFRAA 7 (L) 02/15/2016 0900      COAGS: Recent Labs       Lab Results  Component Value Date   INR 1.18 02/10/2016        ASSESSMENT/PLAN: This is a 55 y.o. male on hd via RUE av fistula now with difficulty cannulating and pulsatility. Will perform RUE fistulogram today with possible intervention. Discussed risks and benefits and consent signed. Premedicate for previous contrast allergy.  Brad Mcgaughy C. Randie Heinzain, MD [facility] [place] Office: [phone] Pager: [phone]

## 2016-02-15 NOTE — Consult Note (Signed)
  Hospital Consult    Reason for Consult:  Malfunctioning RUE avf Referring Physician:  [facility] MRN #:  [id number]  History of Present Illness: This is a 55 y.o. male in [facility]. Has 10months of Right arm avf but does PD as outpatient. Difficulty cannulating avf today with return of clots. Also complains of edema in RUE for a few days. Denies history of intervention on avf.   No past medical history on file.  No past surgical history on file.  Allergies not on file  Prior to Admission medications   Not on File    Social History   Social History  . Marital status: Married    Spouse name: N/A  . Number of children: N/A  . Years of education: N/A   Occupational History  . Not on file.   Social History Main Topics  . Smoking status: Not on file  . Smokeless tobacco: Not on file  . Alcohol use Not on file  . Drug use: Unknown  . Sexual activity: Not on file   Other Topics Concern  . Not on file   Social History Narrative  . No narrative on file      Physical Examination  There were no vitals filed for this visit. There is no height or weight on file to calculate BMI.  General:  WDWN in NAD Gait: Not observed HENT: WNL, normocephalic Pulmonary: normal non-labored breathing, without Rales, rhonchi,  wheezing Cardiac: palpable R radial pulse Abdomen:  soft, NT/ND, no masses Musculoskeletal: right arm edema, pulsatility in [place] avf Neurologic: A&O X 3; Appropriate Affect ; SENSATION: normal; MOTOR FUNCTION:  moving all extremities equally. Speech is fluent/normal   CBC    Component Value Date/Time   WBC 8.1 02/15/2016 0900   RBC 5.03 02/15/2016 0900   HGB 13.4 02/15/2016 0900   HCT 43.6 02/15/2016 0900   PLT 173 02/15/2016 0900   MCV 86.7 02/15/2016 0900   MCH 26.6 02/15/2016 0900   MCHC 30.7 02/15/2016 0900   RDW 16.9 (H) 02/15/2016 0900   LYMPHSABS 0.7 02/15/2016 0900   MONOABS 0.7 02/15/2016 0900   EOSABS 0.2 02/15/2016 0900   BASOSABS 0.0 02/15/2016 0900    BMET    Component Value Date/Time   NA 135 02/15/2016 0900   K 5.8 (H) 02/15/2016 0900   CL 95 (L) 02/15/2016 0900   CO2 27 02/15/2016 0900   GLUCOSE 109 (H) 02/15/2016 0900   BUN 70 (H) 02/15/2016 0900   CREATININE 8.49 (H) 02/15/2016 0900   CALCIUM 9.3 02/15/2016 0900   CALCIUM 8.9 02/10/2016 1730   GFRNONAA 6 (L) 02/15/2016 0900   GFRAA 7 (L) 02/15/2016 0900    COAGS: Lab Results  Component Value Date   INR 1.18 02/10/2016      ASSESSMENT/PLAN: This is a 55 y.o. male on hd via RUE av fistula now with difficulty cannulating and pulsatility. Will perform RUE fistulogram today with possible intervention. Discussed risks and benefits and consent signed. Premedicate for previous contrast allergy.  Ronnetta Currington C. Randie Heinzain, MD [facility] Office: [phone] Pager: [phone]

## 2016-02-16 LAB — BASIC METABOLIC PANEL
Anion gap: 15 (ref 5–15)
BUN: 89 mg/dL — ABNORMAL HIGH (ref 6–20)
CALCIUM: 9.1 mg/dL (ref 8.9–10.3)
CHLORIDE: 95 mmol/L — AB (ref 101–111)
CO2: 25 mmol/L (ref 22–32)
CREATININE: 9.34 mg/dL — AB (ref 0.61–1.24)
GFR calc Af Amer: 6 mL/min — ABNORMAL LOW (ref >60)
GFR calc non Af Amer: 6 mL/min — ABNORMAL LOW (ref >60)
GLUCOSE: 104 mg/dL — AB (ref 65–99)
Potassium: 5.6 mmol/L — ABNORMAL HIGH (ref 3.5–5.1)
Sodium: 135 mmol/L (ref 135–145)

## 2016-02-16 LAB — CBC
HCT: 23.1 % — ABNORMAL LOW (ref 39.0–52.0)
HEMOGLOBIN: 7.1 g/dL — AB (ref 13.0–17.0)
MCH: 27.2 pg (ref 26.0–34.0)
MCHC: 30.7 g/dL (ref 30.0–36.0)
MCV: 88.5 fL (ref 78.0–100.0)
PLATELETS: 283 10*3/uL (ref 150–400)
RBC: 2.61 MIL/uL — ABNORMAL LOW (ref 4.22–5.81)
RDW: 16.7 % — ABNORMAL HIGH (ref 11.5–15.5)
WBC: 7.2 10*3/uL (ref 4.0–10.5)

## 2016-02-17 LAB — BASIC METABOLIC PANEL
ANION GAP: 14 (ref 5–15)
BUN: 80 mg/dL — AB (ref 6–20)
CHLORIDE: 96 mmol/L — AB (ref 101–111)
CO2: 26 mmol/L (ref 22–32)
Calcium: 8.9 mg/dL (ref 8.9–10.3)
Creatinine, Ser: 9.34 mg/dL — ABNORMAL HIGH (ref 0.61–1.24)
GFR calc Af Amer: 6 mL/min — ABNORMAL LOW (ref >60)
GFR, EST NON AFRICAN AMERICAN: 6 mL/min — AB (ref >60)
Glucose, Bld: 135 mg/dL — ABNORMAL HIGH (ref 65–99)
POTASSIUM: 5.9 mmol/L — AB (ref 3.5–5.1)
Sodium: 136 mmol/L (ref 135–145)

## 2016-02-17 LAB — RENAL FUNCTION PANEL
ALBUMIN: 2.2 g/dL — AB (ref 3.5–5.0)
ANION GAP: 15 (ref 5–15)
BUN: 81 mg/dL — ABNORMAL HIGH (ref 6–20)
CALCIUM: 8.8 mg/dL — AB (ref 8.9–10.3)
CO2: 25 mmol/L (ref 22–32)
CREATININE: 9.32 mg/dL — AB (ref 0.61–1.24)
Chloride: 95 mmol/L — ABNORMAL LOW (ref 101–111)
GFR, EST AFRICAN AMERICAN: 6 mL/min — AB (ref >60)
GFR, EST NON AFRICAN AMERICAN: 6 mL/min — AB (ref >60)
Glucose, Bld: 133 mg/dL — ABNORMAL HIGH (ref 65–99)
PHOSPHORUS: 9.6 mg/dL — AB (ref 2.5–4.6)
Potassium: 5.9 mmol/L — ABNORMAL HIGH (ref 3.5–5.1)
SODIUM: 135 mmol/L (ref 135–145)

## 2016-02-17 LAB — CBC
HCT: 23.2 % — ABNORMAL LOW (ref 39.0–52.0)
HEMOGLOBIN: 7 g/dL — AB (ref 13.0–17.0)
MCH: 27.3 pg (ref 26.0–34.0)
MCHC: 30.2 g/dL (ref 30.0–36.0)
MCV: 90.6 fL (ref 78.0–100.0)
Platelets: 257 10*3/uL (ref 150–400)
RBC: 2.56 MIL/uL — AB (ref 4.22–5.81)
RDW: 17.1 % — ABNORMAL HIGH (ref 11.5–15.5)
WBC: 6.8 10*3/uL (ref 4.0–10.5)

## 2016-02-17 NOTE — Progress Notes (Signed)
Subjective:    pAngioplasty of dialysis access done on 10/25 Potassium 5.9 this morning Phosphorus 9.6 Patient is doing well No c./o Starting PT today  Objective:  Vital signs in last 24 hours:     T 97.3  P 92  RR 32 BP 93/64  Weight change:  There were no vitals filed for this visit.  Intake/Output:   No intake or output data in the 24 hours ending 02/17/16 0938   Physical Exam: General: NAD, laying in the bed  HEENT Anicteric  Neck supple  Pulm/lungs Clear b/l  CVS/Heart No rub  Abdomen:  Soft, distended, mild tenderness, PD cathter in place  Extremities: Trace peripheral edema, rt arm edema distal to access  Neurologic: Alert, oriented  Skin: No acute rashes  Access: Rt upper arm AVF       Basic Metabolic Panel:   Recent Labs Lab 02/11/16 0600 02/12/16 0637 02/13/16 0500 02/13/16 1127 02/14/16 0619 02/15/16 0900 02/16/16 1207 02/17/16 0611  NA 135 139 136  --  136 135 135 135  136  K 4.2 4.8 6.1* 4.0 5.2* 5.8* 5.6* 5.9*  5.9*  CL 97* 99* 96*  --  97* 95* 95* 95*  96*  CO2 29 29 26   --  29 27 25 25  26   GLUCOSE 121* 58* 135*  --  62* 109* 104* 133*  135*  BUN 44* 63* 80*  --  51* 70* 89* 81*  80*  CREATININE 5.03* 6.98* 8.59*  --  6.60* 8.49* 9.34* 9.32*  9.34*  CALCIUM 9.0 9.5 9.3  --  8.9 9.3 9.1 8.8*  8.9  MG 2.4 2.6*  --   --  2.3 2.7*  --   --   PHOS 5.7* 8.2* 10.5*  --  7.9* 9.9*  --  9.6*     CBC:  Recent Labs Lab 02/11/16 0600 02/12/16 0637 02/13/16 0500 02/14/16 0619 02/15/16 0900 02/16/16 1207 02/17/16 0611  WBC 6.6 7.3 8.0 5.6 8.1 7.2 6.8  NEUTROABS 5.1 5.5  --  4.2 6.4  --   --   HGB 7.5* 7.2* 7.3* 6.5* 13.4 7.1* 7.0*  HCT 24.0* 24.1* 24.1* 21.4* 43.6 23.1* 23.2*  MCV 87.6 88.9 88.6 90.3 86.7 88.5 90.6  PLT 274 300 305 253 173 283 257      Microbiology:  Recent Results (from the past 720 hour(s))  C difficile quick scan w PCR reflex     Status: Abnormal   Collection Time: 02/10/16 10:13 AM  Result Value  Ref Range Status   C Diff antigen POSITIVE (A) NEGATIVE Final   C Diff toxin NEGATIVE NEGATIVE Final   C Diff interpretation Results are indeterminate. See PCR results.  Final  Clostridium Difficile by PCR     Status: Abnormal   Collection Time: 02/10/16 10:13 AM  Result Value Ref Range Status   Toxigenic C Difficile by pcr POSITIVE (A) NEGATIVE Final    Comment: Positive for toxigenic C. difficile with little to no toxin production. Only treat if clinical presentation suggests symptomatic illness.  Culture, blood (single)     Status: None (Preliminary result)   Collection Time: 02/13/16  9:30 AM  Result Value Ref Range Status   Specimen Description BLOOD  RIGHT FISTULA  Final   Special Requests BOTTLES DRAWN AEROBIC AND ANAEROBIC  10CC  Final   Culture NO GROWTH 3 DAYS  Final   Report Status PENDING  Incomplete    Coagulation Studies: No results for input(s): LABPROT, INR in  the last 72 hours.  Urinalysis: No results for input(s): COLORURINE, LABSPEC, PHURINE, GLUCOSEU, HGBUR, BILIRUBINUR, KETONESUR, PROTEINUR, UROBILINOGEN, NITRITE, LEUKOCYTESUR in the last 72 hours.  Invalid input(s): APPERANCEUR    Imaging: No results found.   Medications:       Assessment/ Plan:  55 y.o. caucasian male with  medical problems of end-stage renal disease started hemodialysis 2015, then switch to peritoneal dialysis in July 2016, diabetes, hyperlipidemia, gouty arthritis, polycystic kidney disease, hypertension, obstructive sleep apnea, chronic back pain from arthritis, who [facility]   1. End-stage renal disease Continue Monday, Wednesday, Friday schedule Dialysis today, then Monday UF limited by low BP   2. Anemia of chronic kidney disease Current hemoglobin 7 T sat 22% Continue Aranesp Blood transfusion given 10/24   3. Secondary hyperparathyroidism Monitor phosphorus during hospital stay Currently 9.6 Continue renvela - 1600 -  3 times a day with meals, Sensipar 60 mg daily  4. MSSA infection Antibiotics as per primary team  5. C Diff positive ABx per IM team  6 .Hyperkelamia - Low K diet - Dialysis    LOS: 0 Danielle Lento 10/27/20179:38 AM

## 2016-02-18 LAB — RENAL FUNCTION PANEL
ALBUMIN: 2.4 g/dL — AB (ref 3.5–5.0)
ANION GAP: 17 — AB (ref 5–15)
BUN: 97 mg/dL — ABNORMAL HIGH (ref 6–20)
CALCIUM: 8.9 mg/dL (ref 8.9–10.3)
CO2: 23 mmol/L (ref 22–32)
Chloride: 98 mmol/L — ABNORMAL LOW (ref 101–111)
Creatinine, Ser: 10.39 mg/dL — ABNORMAL HIGH (ref 0.61–1.24)
GFR, EST AFRICAN AMERICAN: 6 mL/min — AB (ref >60)
GFR, EST NON AFRICAN AMERICAN: 5 mL/min — AB (ref >60)
GLUCOSE: 89 mg/dL (ref 65–99)
PHOSPHORUS: 9.4 mg/dL — AB (ref 2.5–4.6)
Potassium: 6.4 mmol/L (ref 3.5–5.1)
SODIUM: 138 mmol/L (ref 135–145)

## 2016-02-18 LAB — CBC
HCT: 22.3 % — ABNORMAL LOW (ref 39.0–52.0)
HEMOGLOBIN: 6.8 g/dL — AB (ref 13.0–17.0)
MCH: 27.8 pg (ref 26.0–34.0)
MCHC: 30.5 g/dL (ref 30.0–36.0)
MCV: 91 fL (ref 78.0–100.0)
Platelets: 246 10*3/uL (ref 150–400)
RBC: 2.45 MIL/uL — AB (ref 4.22–5.81)
RDW: 16.7 % — ABNORMAL HIGH (ref 11.5–15.5)
WBC: 7.2 10*3/uL (ref 4.0–10.5)

## 2016-02-18 LAB — CULTURE, BLOOD (SINGLE): Culture: NO GROWTH

## 2016-02-18 LAB — PREPARE RBC (CROSSMATCH)

## 2016-02-19 LAB — TYPE AND SCREEN
ABO/RH(D): O POS
Antibody Screen: NEGATIVE
Unit division: 0

## 2016-02-19 LAB — CBC
HEMATOCRIT: 24.1 % — AB (ref 39.0–52.0)
HEMOGLOBIN: 7.2 g/dL — AB (ref 13.0–17.0)
MCH: 26.3 pg (ref 26.0–34.0)
MCHC: 29.9 g/dL — AB (ref 30.0–36.0)
MCV: 88 fL (ref 78.0–100.0)
Platelets: 261 10*3/uL (ref 150–400)
RBC: 2.74 MIL/uL — ABNORMAL LOW (ref 4.22–5.81)
RDW: 17.1 % — ABNORMAL HIGH (ref 11.5–15.5)
WBC: 7.6 10*3/uL (ref 4.0–10.5)

## 2016-02-19 LAB — BASIC METABOLIC PANEL
ANION GAP: 13 (ref 5–15)
BUN: 58 mg/dL — ABNORMAL HIGH (ref 6–20)
CALCIUM: 8.7 mg/dL — AB (ref 8.9–10.3)
CO2: 27 mmol/L (ref 22–32)
Chloride: 96 mmol/L — ABNORMAL LOW (ref 101–111)
Creatinine, Ser: 7.27 mg/dL — ABNORMAL HIGH (ref 0.61–1.24)
GFR calc Af Amer: 9 mL/min — ABNORMAL LOW (ref >60)
GFR calc non Af Amer: 8 mL/min — ABNORMAL LOW (ref >60)
GLUCOSE: 78 mg/dL (ref 65–99)
Potassium: 4.9 mmol/L (ref 3.5–5.1)
Sodium: 136 mmol/L (ref 135–145)

## 2016-02-19 LAB — POTASSIUM: POTASSIUM: 4.7 mmol/L (ref 3.5–5.1)

## 2016-02-20 LAB — BASIC METABOLIC PANEL
Anion gap: 12 (ref 5–15)
BUN: 80 mg/dL — AB (ref 6–20)
CHLORIDE: 97 mmol/L — AB (ref 101–111)
CO2: 26 mmol/L (ref 22–32)
CREATININE: 8.7 mg/dL — AB (ref 0.61–1.24)
Calcium: 9.5 mg/dL (ref 8.9–10.3)
GFR calc Af Amer: 7 mL/min — ABNORMAL LOW (ref >60)
GFR calc non Af Amer: 6 mL/min — ABNORMAL LOW (ref >60)
Glucose, Bld: 112 mg/dL — ABNORMAL HIGH (ref 65–99)
Potassium: 5.3 mmol/L — ABNORMAL HIGH (ref 3.5–5.1)
Sodium: 135 mmol/L (ref 135–145)

## 2016-02-20 LAB — CBC
HEMATOCRIT: 23.5 % — AB (ref 39.0–52.0)
HEMOGLOBIN: 7.3 g/dL — AB (ref 13.0–17.0)
MCH: 27.3 pg (ref 26.0–34.0)
MCHC: 31.1 g/dL (ref 30.0–36.0)
MCV: 88 fL (ref 78.0–100.0)
Platelets: 244 10*3/uL (ref 150–400)
RBC: 2.67 MIL/uL — ABNORMAL LOW (ref 4.22–5.81)
RDW: 16.8 % — ABNORMAL HIGH (ref 11.5–15.5)
WBC: 8.2 10*3/uL (ref 4.0–10.5)

## 2016-02-20 LAB — MAGNESIUM: Magnesium: 2.6 mg/dL — ABNORMAL HIGH (ref 1.7–2.4)

## 2016-02-20 LAB — PHOSPHORUS: Phosphorus: 8.4 mg/dL — ABNORMAL HIGH (ref 2.5–4.6)

## 2016-02-21 NOTE — Progress Notes (Signed)
Subjective:  Patient in good spirits today. He is due for hemodialysis again tomorrow. Was able to stand up today.  Objective:  Vital signs in last 24 hours:  Temperature 90.8 pulse 90 respirations 20 blood pressure 1:30/70  Physical Exam: General: NAD, laying in the bed  HEENT Anicteric  Neck supple  Pulm/lungs Clear b/l, normal effort  CVS/Heart No rub S1S2  Abdomen:  Soft, distended, mild tenderness, PD catheter in place  Extremities: Trace peripheral edema, rt arm edema distal to access  Neurologic: Alert, oriented x3  Skin: No acute rashes  Access: Rt upper arm AVF       Basic Metabolic Panel:   Recent Labs Lab 02/15/16 0900 02/16/16 1207 02/17/16 0611 02/18/16 0852 02/19/16 0614 02/19/16 1348 02/20/16 0704  NA 135 135 135  136 138 136  --  135  K 5.8* 5.6* 5.9*  5.9* 6.4* 4.9 4.7 5.3*  CL 95* 95* 95*  96* 98* 96*  --  97*  CO2 27 25 25  26 23 27   --  26  GLUCOSE 109* 104* 133*  135* 89 78  --  112*  BUN 70* 89* 81*  80* 97* 58*  --  80*  CREATININE 8.49* 9.34* 9.32*  9.34* 10.39* 7.27*  --  8.70*  CALCIUM 9.3 9.1 8.8*  8.9 8.9 8.7*  --  9.5  MG 2.7*  --   --   --   --   --  2.6*  PHOS 9.9*  --  9.6* 9.4*  --   --  8.4*     CBC:  Recent Labs Lab 02/15/16 0900 02/16/16 1207 02/17/16 0611 02/18/16 0852 02/19/16 0614 02/20/16 0704  WBC 8.1 7.2 6.8 7.2 7.6 8.2  NEUTROABS 6.4  --   --   --   --   --   HGB 13.4 7.1* 7.0* 6.8* 7.2* 7.3*  HCT 43.6 23.1* 23.2* 22.3* 24.1* 23.5*  MCV 86.7 88.5 90.6 91.0 88.0 88.0  PLT 173 283 257 246 261 244      Microbiology:  Recent Results (from the past 720 hour(s))  C difficile quick scan w PCR reflex     Status: Abnormal   Collection Time: 02/10/16 10:13 AM  Result Value Ref Range Status   C Diff antigen POSITIVE (A) NEGATIVE Final   C Diff toxin NEGATIVE NEGATIVE Final   C Diff interpretation Results are indeterminate. See PCR results.  Final  Clostridium Difficile by PCR     Status: Abnormal   Collection Time: 02/10/16 10:13 AM  Result Value Ref Range Status   Toxigenic C Difficile by pcr POSITIVE (A) NEGATIVE Final    Comment: Positive for toxigenic C. difficile with little to no toxin production. Only treat if clinical presentation suggests symptomatic illness.  Culture, blood (single)     Status: None   Collection Time: 02/13/16  9:30 AM  Result Value Ref Range Status   Specimen Description BLOOD  RIGHT FISTULA  Final   Special Requests BOTTLES DRAWN AEROBIC AND ANAEROBIC  10CC  Final   Culture NO GROWTH 5 DAYS  Final   Report Status 02/18/2016 FINAL  Final    Coagulation Studies: No results for input(s): LABPROT, INR in the last 72 hours.  Urinalysis: No results for input(s): COLORURINE, LABSPEC, PHURINE, GLUCOSEU, HGBUR, BILIRUBINUR, KETONESUR, PROTEINUR, UROBILINOGEN, NITRITE, LEUKOCYTESUR in the last 72 hours.  Invalid input(s): APPERANCEUR    Imaging: No results found.   Medications:       Assessment/ Plan:  55 y.o. caucasian male with  medical problems of end-stage renal disease started hemodialysis 2015, then switch to peritoneal dialysis in July 2016, diabetes, hyperlipidemia, gouty arthritis, polycystic kidney disease, hypertension, obstructive sleep apnea, chronic back pain from arthritis, who [facility]   1. End-stage renal disease Patient due for hemodialysis tomorrow. We will prepare orders.  2. Anemia of chronic kidney disease Hemoglobin 7.3. Blood transfusion threshold this hemoglobin of 7. Continue Aranesp.   3. Secondary hyperparathyroidism Phosphorus remains quite high. Add PhosLo to medication regimen.  4. MSSA infection Antibiotics as per primary team  5. C Diff positive ABx per IM team  6 .Hyperkelamia Potassium currently 5.3. Repeat serum potassium tomorrow. Dialysis should help to bring this down.    LOS: 0 Yehudit Fulginiti 10/31/20174:08 PM

## 2016-02-22 LAB — RENAL FUNCTION PANEL
ALBUMIN: 2.3 g/dL — AB (ref 3.5–5.0)
Anion gap: 12 (ref 5–15)
BUN: 60 mg/dL — AB (ref 6–20)
CALCIUM: 9.6 mg/dL (ref 8.9–10.3)
CO2: 28 mmol/L (ref 22–32)
CREATININE: 7.94 mg/dL — AB (ref 0.61–1.24)
Chloride: 99 mmol/L — ABNORMAL LOW (ref 101–111)
GFR calc Af Amer: 8 mL/min — ABNORMAL LOW (ref >60)
GFR calc non Af Amer: 7 mL/min — ABNORMAL LOW (ref >60)
GLUCOSE: 158 mg/dL — AB (ref 65–99)
PHOSPHORUS: 7.3 mg/dL — AB (ref 2.5–4.6)
Potassium: 5 mmol/L (ref 3.5–5.1)
SODIUM: 139 mmol/L (ref 135–145)

## 2016-02-22 LAB — CBC
HCT: 22.6 % — ABNORMAL LOW (ref 39.0–52.0)
Hemoglobin: 6.8 g/dL — CL (ref 13.0–17.0)
MCH: 27.1 pg (ref 26.0–34.0)
MCHC: 30.1 g/dL (ref 30.0–36.0)
MCV: 90 fL (ref 78.0–100.0)
PLATELETS: 262 10*3/uL (ref 150–400)
RBC: 2.51 MIL/uL — ABNORMAL LOW (ref 4.22–5.81)
RDW: 16.6 % — AB (ref 11.5–15.5)
WBC: 7.7 10*3/uL (ref 4.0–10.5)

## 2016-02-22 LAB — PREPARE RBC (CROSSMATCH)

## 2016-02-22 NOTE — Progress Notes (Signed)
Subjective:  Patient due for hemodialysis today. Hemoglobin down to 6.8. Contusion plan during dialysis.  Objective:  Vital signs in last 24 hours:  Temperature 98.6 pulse 70 respirations 16 blood pressure 1:30/64  Physical Exam: General: NAD, laying in the bed  HEENT Anicteric  Neck supple  Pulm/lungs Clear b/l, normal effort  CVS/Heart No rub S1S2  Abdomen:  Soft, distended, PD catheter in place  Extremities: Trace peripheral edema, rt arm edema distal to access  Neurologic: Alert, oriented x3  Skin: No acute rashes  Access: Rt upper arm AVF       Basic Metabolic Panel:   Recent Labs Lab 02/17/16 0611 02/18/16 0852 02/19/16 0614 02/19/16 1348 02/20/16 0704 02/22/16 0746  NA 135  136 138 136  --  135 139  K 5.9*  5.9* 6.4* 4.9 4.7 5.3* 5.0  CL 95*  96* 98* 96*  --  97* 99*  CO2 25  26 23 27   --  26 28  GLUCOSE 133*  135* 89 78  --  112* 158*  BUN 81*  80* 97* 58*  --  80* 60*  CREATININE 9.32*  9.34* 10.39* 7.27*  --  8.70* 7.94*  CALCIUM 8.8*  8.9 8.9 8.7*  --  9.5 9.6  MG  --   --   --   --  2.6*  --   PHOS 9.6* 9.4*  --   --  8.4* 7.3*     CBC:  Recent Labs Lab 02/17/16 0611 02/18/16 0852 02/19/16 0614 02/20/16 0704 02/22/16 0746  WBC 6.8 7.2 7.6 8.2 7.7  HGB 7.0* 6.8* 7.2* 7.3* 6.8*  HCT 23.2* 22.3* 24.1* 23.5* 22.6*  MCV 90.6 91.0 88.0 88.0 90.0  PLT 257 246 261 244 262      Microbiology:  Recent Results (from the past 720 hour(s))  C difficile quick scan w PCR reflex     Status: Abnormal   Collection Time: 02/10/16 10:13 AM  Result Value Ref Range Status   C Diff antigen POSITIVE (A) NEGATIVE Final   C Diff toxin NEGATIVE NEGATIVE Final   C Diff interpretation Results are indeterminate. See PCR results.  Final  Clostridium Difficile by PCR     Status: Abnormal   Collection Time: 02/10/16 10:13 AM  Result Value Ref Range Status   Toxigenic C Difficile by pcr POSITIVE (A) NEGATIVE Final    Comment: Positive for toxigenic C.  difficile with little to no toxin production. Only treat if clinical presentation suggests symptomatic illness.  Culture, blood (single)     Status: None   Collection Time: 02/13/16  9:30 AM  Result Value Ref Range Status   Specimen Description BLOOD  RIGHT FISTULA  Final   Special Requests BOTTLES DRAWN AEROBIC AND ANAEROBIC  10CC  Final   Culture NO GROWTH 5 DAYS  Final   Report Status 02/18/2016 FINAL  Final    Coagulation Studies: No results for input(s): LABPROT, INR in the last 72 hours.  Urinalysis: No results for input(s): COLORURINE, LABSPEC, PHURINE, GLUCOSEU, HGBUR, BILIRUBINUR, KETONESUR, PROTEINUR, UROBILINOGEN, NITRITE, LEUKOCYTESUR in the last 72 hours.  Invalid input(s): APPERANCEUR    Imaging: No results found.   Medications:       Assessment/ Plan:  55 y.o. caucasian male with  medical problems of end-stage renal disease started hemodialysis 2015, then switch to peritoneal dialysis in July 2016, diabetes, hyperlipidemia, gouty arthritis, polycystic kidney disease, hypertension, obstructive sleep apnea, chronic back pain from arthritis, who [facility]   1. End-stage renal disease Patient due for hemodialysis today. Orders have been prepared. Blood transfusion during dialysis.  2. Anemia of chronic kidney disease Hemoglobin down to 6.8. Patient to receive blood transfusion during dialysis today. Continue Aranesp.   3. Secondary hyperparathyroidism Patient now on PhosLo and sevelamer. Continue to monitor phosphorus.  4. MSSA infection Antibiotics as per primary team  5 .Hyperkelamia Potassium down to 5.0 today. Continue to monitor serum potassium on dialysis days.    LOS: 0 Kirkland Figg 11/1/201710:47 AM

## 2016-02-23 LAB — TYPE AND SCREEN
ABO/RH(D): O POS
ANTIBODY SCREEN: NEGATIVE
Unit division: 0

## 2016-02-23 LAB — CBC
HCT: 26.9 % — ABNORMAL LOW (ref 39.0–52.0)
Hemoglobin: 8.2 g/dL — ABNORMAL LOW (ref 13.0–17.0)
MCH: 27.4 pg (ref 26.0–34.0)
MCHC: 30.5 g/dL (ref 30.0–36.0)
MCV: 90 fL (ref 78.0–100.0)
PLATELETS: 259 10*3/uL (ref 150–400)
RBC: 2.99 MIL/uL — AB (ref 4.22–5.81)
RDW: 16.5 % — AB (ref 11.5–15.5)
WBC: 8.9 10*3/uL (ref 4.0–10.5)

## 2016-02-24 LAB — CBC
HEMATOCRIT: 25.2 % — AB (ref 39.0–52.0)
HEMOGLOBIN: 7.8 g/dL — AB (ref 13.0–17.0)
MCH: 27.5 pg (ref 26.0–34.0)
MCHC: 31 g/dL (ref 30.0–36.0)
MCV: 88.7 fL (ref 78.0–100.0)
Platelets: 257 10*3/uL (ref 150–400)
RBC: 2.84 MIL/uL — ABNORMAL LOW (ref 4.22–5.81)
RDW: 16 % — ABNORMAL HIGH (ref 11.5–15.5)
WBC: 10 10*3/uL (ref 4.0–10.5)

## 2016-02-24 LAB — RENAL FUNCTION PANEL
ALBUMIN: 2.3 g/dL — AB (ref 3.5–5.0)
ANION GAP: 13 (ref 5–15)
BUN: 62 mg/dL — ABNORMAL HIGH (ref 6–20)
CO2: 27 mmol/L (ref 22–32)
Calcium: 9.9 mg/dL (ref 8.9–10.3)
Chloride: 97 mmol/L — ABNORMAL LOW (ref 101–111)
Creatinine, Ser: 8.16 mg/dL — ABNORMAL HIGH (ref 0.61–1.24)
GFR, EST AFRICAN AMERICAN: 8 mL/min — AB (ref >60)
GFR, EST NON AFRICAN AMERICAN: 7 mL/min — AB (ref >60)
Glucose, Bld: 121 mg/dL — ABNORMAL HIGH (ref 65–99)
PHOSPHORUS: 6.1 mg/dL — AB (ref 2.5–4.6)
POTASSIUM: 4.5 mmol/L (ref 3.5–5.1)
Sodium: 137 mmol/L (ref 135–145)

## 2016-02-24 NOTE — Progress Notes (Signed)
Subjective:  Patient continues to actively work with physical therapy. He appears to be gaining strength every day. He is due for hemodialysis today..  Objective:  Vital signs in last 24 hours:  Temperature 99.5 pulse 95 respirations 20 blood pressure 109/65  Physical Exam: General: NAD, laying in the bed  HEENT Anicteric  Neck supple  Pulm/lungs Clear b/l, normal effort  CVS/Heart No rub S1S2  Abdomen:  Soft, distended, PD catheter in place  Extremities: Trace peripheral edema, rt arm edema distal to access  Neurologic: Alert, oriented x3  Skin: No acute rashes  Access: Rt upper arm AVF       Basic Metabolic Panel:   Recent Labs Lab 02/18/16 0852 02/19/16 0614 02/19/16 1348 02/20/16 0704 02/22/16 0746 02/24/16 0407  NA 138 136  --  135 139 137  K 6.4* 4.9 4.7 5.3* 5.0 4.5  CL 98* 96*  --  97* 99* 97*  CO2 23 27  --  26 28 27   GLUCOSE 89 78  --  112* 158* 121*  BUN 97* 58*  --  80* 60* 62*  CREATININE 10.39* 7.27*  --  8.70* 7.94* 8.16*  CALCIUM 8.9 8.7*  --  9.5 9.6 9.9  MG  --   --   --  2.6*  --   --   PHOS 9.4*  --   --  8.4* 7.3* 6.1*     CBC:  Recent Labs Lab 02/19/16 0614 02/20/16 0704 02/22/16 0746 02/23/16 0756 02/24/16 0407  WBC 7.6 8.2 7.7 8.9 10.0  HGB 7.2* 7.3* 6.8* 8.2* 7.8*  HCT 24.1* 23.5* 22.6* 26.9* 25.2*  MCV 88.0 88.0 90.0 90.0 88.7  PLT 261 244 262 259 257      Microbiology:  Recent Results (from the past 720 hour(s))  C difficile quick scan w PCR reflex     Status: Abnormal   Collection Time: 02/10/16 10:13 AM  Result Value Ref Range Status   C Diff antigen POSITIVE (A) NEGATIVE Final   C Diff toxin NEGATIVE NEGATIVE Final   C Diff interpretation Results are indeterminate. See PCR results.  Final  Clostridium Difficile by PCR     Status: Abnormal   Collection Time: 02/10/16 10:13 AM  Result Value Ref Range Status   Toxigenic C Difficile by pcr POSITIVE (A) NEGATIVE Final    Comment: Positive for toxigenic C.  difficile with little to no toxin production. Only treat if clinical presentation suggests symptomatic illness.  Culture, blood (single)     Status: None   Collection Time: 02/13/16  9:30 AM  Result Value Ref Range Status   Specimen Description BLOOD  RIGHT FISTULA  Final   Special Requests BOTTLES DRAWN AEROBIC AND ANAEROBIC  10CC  Final   Culture NO GROWTH 5 DAYS  Final   Report Status 02/18/2016 FINAL  Final    Coagulation Studies: No results for input(s): LABPROT, INR in the last 72 hours.  Urinalysis: No results for input(s): COLORURINE, LABSPEC, PHURINE, GLUCOSEU, HGBUR, BILIRUBINUR, KETONESUR, PROTEINUR, UROBILINOGEN, NITRITE, LEUKOCYTESUR in the last 72 hours.  Invalid input(s): APPERANCEUR    Imaging: No results found.   Medications:       Assessment/ Plan:  55 y.o. caucasian male with  medical problems of end-stage renal disease started hemodialysis 2015, then switch to peritoneal dialysis in July 2016, diabetes, hyperlipidemia, gouty arthritis, polycystic kidney disease, hypertension, obstructive sleep apnea, chronic back pain from arthritis, who [facility]   1. End-stage  renal disease Dialysis orders prepared for today.  Patient will need dialysis again on Monday as well.  2. Anemia of chronic kidney disease Hemoglobin currently up to 7.8.  Patient recently received blood transfusion.  Also continue Aranesp.   3. Secondary hyperparathyroidism hosphorus down to 6.1.  Continue sevelamer as well as PhosLo.  4. MSSA infection Antibiotics as per primary team  5 .Hyperkelamia Potassium now normalized at 4.5.  Continue to monitor.    LOS: 0 [name] [name] 11/3/20171:55 PM

## 2016-02-27 LAB — RENAL FUNCTION PANEL
ALBUMIN: 2.6 g/dL — AB (ref 3.5–5.0)
Anion gap: 12 (ref 5–15)
BUN: 62 mg/dL — AB (ref 6–20)
CHLORIDE: 99 mmol/L — AB (ref 101–111)
CO2: 27 mmol/L (ref 22–32)
Calcium: 10.4 mg/dL — ABNORMAL HIGH (ref 8.9–10.3)
Creatinine, Ser: 9.11 mg/dL — ABNORMAL HIGH (ref 0.61–1.24)
GFR calc Af Amer: 7 mL/min — ABNORMAL LOW (ref >60)
GFR calc non Af Amer: 6 mL/min — ABNORMAL LOW (ref >60)
GLUCOSE: 126 mg/dL — AB (ref 65–99)
POTASSIUM: 5.2 mmol/L — AB (ref 3.5–5.1)
Phosphorus: 6.7 mg/dL — ABNORMAL HIGH (ref 2.5–4.6)
Sodium: 138 mmol/L (ref 135–145)

## 2016-02-27 LAB — CBC
HEMATOCRIT: 27.6 % — AB (ref 39.0–52.0)
Hemoglobin: 8.3 g/dL — ABNORMAL LOW (ref 13.0–17.0)
MCH: 26.9 pg (ref 26.0–34.0)
MCHC: 30.1 g/dL (ref 30.0–36.0)
MCV: 89.6 fL (ref 78.0–100.0)
Platelets: 229 10*3/uL (ref 150–400)
RBC: 3.08 MIL/uL — ABNORMAL LOW (ref 4.22–5.81)
RDW: 15.8 % — AB (ref 11.5–15.5)
WBC: 7.9 10*3/uL (ref 4.0–10.5)

## 2016-02-27 NOTE — Progress Notes (Signed)
  Subjective:  Patient continues to actively work with physical therapy. He appears to be gaining strength every day. He is due for hemodialysis today.  Objective:  Vital signs in last 24 hours:  Temperature 97.5 pulse 71 respirations 18 blood pressure 125/71  Physical Exam: General: NAD, laying in the bed  HEENT Anicteric  Neck supple  Pulm/lungs Clear b/l, normal effort  CVS/Heart No rub S1S2  Abdomen:  Soft, distended, PD catheter in place  Extremities: Trace peripheral edema, rt arm edema distal to access  Neurologic: Alert, oriented x3  Skin: No acute rashes  Access: Rt upper arm AVF       Basic Metabolic Panel:   Recent Labs Lab 02/22/16 0746 02/24/16 0407 02/27/16 0814  NA 139 137 138  K 5.0 4.5 5.2*  CL 99* 97* 99*  CO2 28 27 27   GLUCOSE 158* 121* 126*  BUN 60* 62* 62*  CREATININE 7.94* 8.16* 9.11*  CALCIUM 9.6 9.9 10.4*  PHOS 7.3* 6.1* 6.7*     CBC:  Recent Labs Lab 02/22/16 0746 02/23/16 0756 02/24/16 0407 02/27/16 0814  WBC 7.7 8.9 10.0 7.9  HGB 6.8* 8.2* 7.8* 8.3*  HCT 22.6* 26.9* 25.2* 27.6*  MCV 90.0 90.0 88.7 89.6  PLT 262 259 257 229      Microbiology:  Recent Results (from the past 720 hour(s))  C difficile quick scan w PCR reflex     Status: Abnormal   Collection Time: 02/10/16 10:13 AM  Result Value Ref Range Status   C Diff antigen POSITIVE (A) NEGATIVE Final   C Diff toxin NEGATIVE NEGATIVE Final   C Diff interpretation Results are indeterminate. See PCR results.  Final  Clostridium Difficile by PCR     Status: Abnormal   Collection Time: 02/10/16 10:13 AM  Result Value Ref Range Status   Toxigenic C Difficile by pcr POSITIVE (A) NEGATIVE Final    Comment: Positive for toxigenic C. difficile with little to no toxin production. Only treat if clinical presentation suggests symptomatic illness.  Culture, blood (single)     Status: None   Collection Time: 02/13/16  9:30 AM  Result Value Ref Range Status   Specimen  Description BLOOD  RIGHT FISTULA  Final   Special Requests BOTTLES DRAWN AEROBIC AND ANAEROBIC  10CC  Final   Culture NO GROWTH 5 DAYS  Final   Report Status 02/18/2016 FINAL  Final    Coagulation Studies: No results for input(s): LABPROT, INR in the last 72 hours.  Urinalysis: No results for input(s): COLORURINE, LABSPEC, PHURINE, GLUCOSEU, HGBUR, BILIRUBINUR, KETONESUR, PROTEINUR, UROBILINOGEN, NITRITE, LEUKOCYTESUR in the last 72 hours.  Invalid input(s): APPERANCEUR    Imaging: No results found.   Medications:       Assessment/ Plan:  55 y.o. caucasian male with  medical problems of end-stage renal disease started hemodialysis 2015, then switch to peritoneal dialysis in July 2016, diabetes, hyperlipidemia, gouty arthritis, polycystic kidney disease, hypertension, obstructive sleep apnea, chronic back pain from arthritis, who [facility]   1. End-stage renal disease Dialysis schedule M-W-F  2. Anemia of chronic kidney disease Hemoglobin currently up to 8.3.  Patient recently received blood transfusion.  Also continue Aranesp.   3. Secondary hyperparathyroidism Phosphorus  6.7.  Continue sevelamer as well as PhosLo.  4. MSSA infection Antibiotics as per primary team  5 .Hyperkelamia  Continue to monitor.    LOS: 0 Armine Rizzolo 11/6/20175:12 PM

## 2016-02-28 NOTE — PMR Pre-admission (Signed)
[place] PMR Admission Coordinator Pre-Admission Assessment  Patient: [name] [name] is an 55 y.o., male MRN: [id number] DOB: January 26, 1961 Height: '5\' 9"'  (175.3 cm) Weight: 115.2 kg (254 lb)  Insurance Information HMO: No    PPO:       PCP:       IPA:       80/20:       OTHER:   PRIMARY:  Medicare A/B      Policy#: [id number]      Subscriber: Nolon Rod CM Name:        Phone#:       Fax#:   Pre-Cert#:        Employer: Disabled Benefits:  Phone #:       Name: Checked in [place]. Date: 01/21/14     Deduct: $1316      Out of Pocket Max: none      Life Max: unlimited CIR: 100% and has 20 full hospital and 30 copay hospital days remaining     SNF: 100 days Outpatient: 80%     Co-Pay: 20% Home Health: 100%      Co-Pay: none DME: 80%     Co-Pay: 20% Providers: patient's choice  SECONDARY: [employer] of [place]      Policy#: [id number]      Subscriber: [place] Name:        Phone#:       Fax#:   Pre-Cert#:        Employer:  Disabled Benefits:  Phone #: [phone]     Name:   Eff. Date:       Deduct:        Out of Pocket Max:        Life Max:   CIR:        SNF:   Outpatient:       Co-Pay:   Home Health:        Co-Pay:   DME:       Co-Pay:    Emergency Contact Information Contact Information    Name Relation Home Work Mobile   [place] Spouse [phone]  [phone]      Current Medical History  Patient Admitting Diagnosis: Fall with SDH, cardiac arrest, VDRF . History of Present Illness:  A 55 yo male presented initially to [facility] and then transferred to [facility] with uncontrollable shaking, hematuria and mechanical fall with head trauma and loss of consciousness on 01/10/16.  Head CT showed acute subdural hemorrhage.  While at [facility], patient had a cardiac arrest, required CPR, was intubated and was treated for aspiration pneumonia.  Patient was extubaed on 02/02/16.  The patient suffered rib fracutes and sternal fracture post CPR.   Patient was admitted to [facility] on 02/09/16.  He is on enteric precautions for C-Diff and contact precautions for MSSA.  Patient is currently on a dysphagia 3 diet with thin liquids.  He has 02 at 2.5 L [place].  He is receiving HD M-W-F.  PT/OT evaluations were completed with ongoing treatments.  Inpatient rehab was recommended by therapies.  Patient's medical record from [facility] has been reviewed by the rehabilitation admission coordinator and physician.  Past Medical History  Neuropathy secondary to diabetes Status post renal calculi removal CKD ESRD M/W/F Hyperlipidemia Hypertension Gout Obesity with obstructive sleep apnea  Family History   family history is not on file.  Prior Rehab/Hospitalizations Has the patient had major surgery during 100 days prior to admission? No  Current Medications See MAR from [facility]  Patients Current Diet:  Dysphagia 3, Renal diet with thin liquids  Precautions / Restrictions Precautions Precautions: Fall   Has the patient had 2 or more falls or a fall with injury in the past year?Yes.  Patient reports at least 5 falls with the last fall resulting in rib injury with bruising, damage to peritoneal dialysis catheter, and blood in his urine.  Prior Activity Level Community (5-7x/wk): Went out daily, was driving  Prior Functional Level Self Care: Did the patient need help bathing, dressing, using the toilet or eating?  Independent  Indoor Mobility: Did the patient need assistance with walking from room to room (with or without device)? Independent  Stairs: Did the patient need assistance with internal or external stairs (with or without device)? Independent  Functional Cognition: Did the patient need help planning regular tasks such as shopping or remembering to take medications? Independent  Home Assistive Devices / Equipment Home Assistive Devices/Equipment: CPAP  Prior Device Use:  Indicate devices/aids used by the patient prior to current illness, exacerbation or injury? None   Prior Functional Level Current Functional Level  Bed Mobility  Independent  Min assist   Transfers  Independent  Mod assist   Mobility - Walk/Wheelchair  Independent  Min assist (Knee buckles causing mod assist needed.)   Upper Body Dressing  Independent  Mod assist   Lower Body Dressing  Independent  Max assist   Grooming  Independent  Min assist   Eating/Drinking  Independent  Other (set up feeding)   Toilet Transfer  Independent  Max assist   Bladder Continence   WDL  Using urinal   Bowel Management  WDL  Last BM 02/28/16   Stair Climbing  Independent  Total assist   Communication  Intact and verbal  Verbal and intact   Memory  Intact  Alert and oriented   Cooking/Meal Prep  Independent      Housework  Independent    Money Management  Independent    Driving  Independent      Special needs/care consideration BiPAP/CPAP Yes, has CPAP at home for the past 10 years CPM No Continuous Drip IV NO Dialysis Yes        Days M-W-F Life Vest No Oxygen Yes, on 02 2.5 L [place] Special Bed No Trach Size No Wound Vac (area) No      Skin No                               Bowel mgmt: Last BM 02/28/16 Bladder mgmt: Voiding in urinal Diabetic mgmt Yes, on insulin at home  Previous Home Environment Living Arrangements: Spouse/significant other  Lives With: Spouse Available Help at Discharge: Family, Available PRN/intermittently Type of Home: House Home Layout: One level Home Access: Stairs to enter [employer] of Steps: 2 steps to porch then small step up from porch.  Discharge Living Setting Plans for Discharge Living Setting: Patient's home, House, Lives with (comment) (Lives with wife.) Type of Home at Discharge: House Discharge Home Layout: One level Discharge Home Access: Stairs to enter Entrance Stairs-Number of Steps: 2-3 step  entry Does the patient have any problems obtaining your medications?: No  Social/Family/Support Systems Patient Roles: Spouse, Parent (Has a wife, a son and a daughter.) Contact Information: Raymont Andreoni - wife Anticipated Caregiver: wife and inlaws Anticipated Caregiver's Contact Information: Butch Penny - wife - (h) [phone] (c) [phone] Ability/Limitations  of Caregiver: Wife works 8 am to 5 pm.  Patient can go to inlaws during the day as needed. Caregiver Availability: Intermittent Discharge Plan Discussed with Primary Caregiver: Yes Is Caregiver In Agreement with Plan?: Yes Does Caregiver/Family have Issues with Lodging/Transportation while Pt is in Rehab?: No  Goals/Additional Needs Patient/Family Goal for Rehab: PT/OT mod I and supervision goals, SLP mod I and I goals Expected length of stay: 10-14 days Cultural Considerations: None Dietary Needs: Dys 3, thin liquids Equipment Needs: TBD.  Patient would love to have a power wheel chair. Special Service Needs: HD M-W-F (was on PD prior to hospitalization) Pt/Family Agrees to Admission and willing to participate: Yes Program Orientation Provided & Reviewed with Pt/Caregiver Including Roles  & Responsibilities: Yes  Patient Condition: I met with patient at his bedside on [facility] and reviewed his medical record.  He has suffered multiple medical issues over the past 2 months.  He is making steady progress and can tolerate and benefit from 3 hours of therapy a day.  He needs the coordination of the rehab MD and team to regain his function as quickly as possible.  I have shared all information with the rehab MD and have approval for acute inpatient rehab admission for today.  Preadmission Screen Completed By:  Retta Diones, 02/29/2016 2:24 PM ______________________________________________________________________   Discussed status with Dr. Posey Pronto on 02/29/16 at 1425 and received telephone approval for admission today.  Admission  Coordinator:  Retta Diones, time 1425/Date 02/29/16   Assessment/Plan: Diagnosis: SDH, cardiac arrest, VDRF  1. Does the need for close, 24 hr/day  Medical supervision in concert with the patient's rehab needs make it unreasonable for this patient to be served in a less intensive setting? Yes  2. Co-Morbidities requiring supervision/potential complications: Neuropathy secondary to diabetes, ESRD, Hyperlipidemia, Hypertension, Gout, Obesity with obstructive sleep apnea 3. Due to bladder management, safety, skin/wound care, disease management, medication administration and patient education, does the patient require 24 hr/day rehab nursing? Yes 4. Does the patient require coordinated care of a physician, rehab nurse, PT (1-2 hrs/day, 5 days/week) and OT (1-2 hrs/day, 5 days/week) to address physical and functional deficits in the context of the above medical diagnosis(es)? Yes Addressing deficits in the following areas: balance, endurance, locomotion, strength, transferring, bathing, dressing, toileting and psychosocial support 5. Can the patient actively participate in an intensive therapy program of at least 3 hrs of therapy 5 days a week? Yes 6. The potential for patient to make measurable gains while on inpatient rehab is excellent 7. Anticipated functional outcomes upon discharge from inpatients are: supervision and min assist PT, supervision and min assist OT, n/a SLP 8. Estimated rehab length of stay to reach the above functional goals is: 14-19 days. 9. Does the patient have adequate social supports to accommodate these discharge functional goals? Yes 10. Anticipated D/C setting: Home 11. Anticipated post D/C treatments: HH therapy and Home excercise program 12. Overall Rehab/Functional Prognosis: good    RECOMMENDATIONS: This patient's condition is appropriate for continued rehabilitative care in the following setting: CIR Patient has agreed to participate in recommended program.  Yes Note that insurance prior authorization may be required for reimbursement for recommended care.  Retta Diones 02/29/2016  Delice Lesch, MD, Mellody Drown 02/29/16

## 2016-02-29 ENCOUNTER — Inpatient Hospital Stay (HOSPITAL_COMMUNITY)
Admission: RE | Admit: 2016-02-29 | Discharge: 2016-03-09 | DRG: 091 | Disposition: A | Discharge status: Home Health | Payer: Medicare Other | Source: Intra-hospital | Attending: Physical Medicine & Rehabilitation | Admitting: Physical Medicine & Rehabilitation

## 2016-02-29 ENCOUNTER — Encounter: Payer: Self-pay | Admitting: Physical Medicine and Rehabilitation

## 2016-02-29 DIAGNOSIS — Z91041 Radiographic dye allergy status: Secondary | ICD-10-CM

## 2016-02-29 DIAGNOSIS — S065X9S Traumatic subdural hemorrhage with loss of consciousness of unspecified duration, sequela: Secondary | ICD-10-CM

## 2016-02-29 DIAGNOSIS — E1122 Type 2 diabetes mellitus with diabetic chronic kidney disease: Secondary | ICD-10-CM | POA: Diagnosis present

## 2016-02-29 DIAGNOSIS — I10 Essential (primary) hypertension: Secondary | ICD-10-CM

## 2016-02-29 DIAGNOSIS — S065X9A Traumatic subdural hemorrhage with loss of consciousness of unspecified duration, initial encounter: Secondary | ICD-10-CM

## 2016-02-29 DIAGNOSIS — M109 Gout, unspecified: Secondary | ICD-10-CM | POA: Diagnosis present

## 2016-02-29 DIAGNOSIS — Z841 Family history of disorders of kidney and ureter: Secondary | ICD-10-CM | POA: Diagnosis not present

## 2016-02-29 DIAGNOSIS — N2581 Secondary hyperparathyroidism of renal origin: Secondary | ICD-10-CM | POA: Diagnosis present

## 2016-02-29 DIAGNOSIS — Z23 Encounter for immunization: Secondary | ICD-10-CM | POA: Diagnosis present

## 2016-02-29 DIAGNOSIS — Z8674 Personal history of sudden cardiac arrest: Secondary | ICD-10-CM | POA: Diagnosis not present

## 2016-02-29 DIAGNOSIS — Z8271 Family history of polycystic kidney: Secondary | ICD-10-CM

## 2016-02-29 DIAGNOSIS — F419 Anxiety disorder, unspecified: Secondary | ICD-10-CM | POA: Diagnosis present

## 2016-02-29 DIAGNOSIS — Z881 Allergy status to other antibiotic agents status: Secondary | ICD-10-CM

## 2016-02-29 DIAGNOSIS — Z8739 Personal history of other diseases of the musculoskeletal system and connective tissue: Secondary | ICD-10-CM | POA: Diagnosis not present

## 2016-02-29 DIAGNOSIS — I120 Hypertensive chronic kidney disease with stage 5 chronic kidney disease or end stage renal disease: Secondary | ICD-10-CM | POA: Diagnosis present

## 2016-02-29 DIAGNOSIS — Z992 Dependence on renal dialysis: Secondary | ICD-10-CM | POA: Diagnosis not present

## 2016-02-29 DIAGNOSIS — G4733 Obstructive sleep apnea (adult) (pediatric): Secondary | ICD-10-CM | POA: Diagnosis present

## 2016-02-29 DIAGNOSIS — D638 Anemia in other chronic diseases classified elsewhere: Secondary | ICD-10-CM | POA: Diagnosis not present

## 2016-02-29 DIAGNOSIS — D631 Anemia in chronic kidney disease: Secondary | ICD-10-CM | POA: Diagnosis present

## 2016-02-29 DIAGNOSIS — R2689 Other abnormalities of gait and mobility: Secondary | ICD-10-CM | POA: Diagnosis present

## 2016-02-29 DIAGNOSIS — Z79899 Other long term (current) drug therapy: Secondary | ICD-10-CM

## 2016-02-29 DIAGNOSIS — E1142 Type 2 diabetes mellitus with diabetic polyneuropathy: Secondary | ICD-10-CM

## 2016-02-29 DIAGNOSIS — Z885 Allergy status to narcotic agent status: Secondary | ICD-10-CM

## 2016-02-29 DIAGNOSIS — E1140 Type 2 diabetes mellitus with diabetic neuropathy, unspecified: Secondary | ICD-10-CM | POA: Diagnosis present

## 2016-02-29 DIAGNOSIS — S065X2S Traumatic subdural hemorrhage with loss of consciousness of 31 minutes to 59 minutes, sequela: Secondary | ICD-10-CM | POA: Diagnosis not present

## 2016-02-29 DIAGNOSIS — Z87820 Personal history of traumatic brain injury: Secondary | ICD-10-CM | POA: Diagnosis not present

## 2016-02-29 DIAGNOSIS — Z6837 Body mass index (BMI) 37.0-37.9, adult: Secondary | ICD-10-CM | POA: Diagnosis not present

## 2016-02-29 DIAGNOSIS — Z87891 Personal history of nicotine dependence: Secondary | ICD-10-CM

## 2016-02-29 DIAGNOSIS — N186 End stage renal disease: Secondary | ICD-10-CM | POA: Diagnosis present

## 2016-02-29 DIAGNOSIS — S065XAA Traumatic subdural hemorrhage with loss of consciousness status unknown, initial encounter: Secondary | ICD-10-CM

## 2016-02-29 DIAGNOSIS — S065X1S Traumatic subdural hemorrhage with loss of consciousness of 30 minutes or less, sequela: Secondary | ICD-10-CM | POA: Diagnosis not present

## 2016-02-29 DIAGNOSIS — S065X2A Traumatic subdural hemorrhage with loss of consciousness of 31 minutes to 59 minutes, initial encounter: Secondary | ICD-10-CM | POA: Diagnosis present

## 2016-02-29 DIAGNOSIS — Z833 Family history of diabetes mellitus: Secondary | ICD-10-CM

## 2016-02-29 DIAGNOSIS — Q613 Polycystic kidney, unspecified: Secondary | ICD-10-CM

## 2016-02-29 DIAGNOSIS — Z9119 Patient's noncompliance with other medical treatment and regimen: Secondary | ICD-10-CM

## 2016-02-29 LAB — RENAL FUNCTION PANEL
ALBUMIN: 2.3 g/dL — AB (ref 3.5–5.0)
Anion gap: 11 (ref 5–15)
BUN: 29 mg/dL — AB (ref 6–20)
CO2: 27 mmol/L (ref 22–32)
Calcium: 9.4 mg/dL (ref 8.9–10.3)
Chloride: 100 mmol/L — ABNORMAL LOW (ref 101–111)
Creatinine, Ser: 5.24 mg/dL — ABNORMAL HIGH (ref 0.61–1.24)
GFR, EST AFRICAN AMERICAN: 13 mL/min — AB (ref >60)
GFR, EST NON AFRICAN AMERICAN: 11 mL/min — AB (ref >60)
Glucose, Bld: 90 mg/dL (ref 65–99)
PHOSPHORUS: 3.9 mg/dL (ref 2.5–4.6)
POTASSIUM: 4 mmol/L (ref 3.5–5.1)
Sodium: 138 mmol/L (ref 135–145)

## 2016-02-29 LAB — CBC
HEMATOCRIT: 26 % — AB (ref 39.0–52.0)
HEMOGLOBIN: 8 g/dL — AB (ref 13.0–17.0)
MCH: 27.3 pg (ref 26.0–34.0)
MCHC: 30.8 g/dL (ref 30.0–36.0)
MCV: 88.7 fL (ref 78.0–100.0)
Platelets: 211 10*3/uL (ref 150–400)
RBC: 2.93 MIL/uL — AB (ref 4.22–5.81)
RDW: 15.6 % — AB (ref 11.5–15.5)
WBC: 6.7 10*3/uL (ref 4.0–10.5)

## 2016-02-29 LAB — GLUCOSE, CAPILLARY
GLUCOSE-CAPILLARY: 162 mg/dL — AB (ref 65–99)
Glucose-Capillary: 147 mg/dL — ABNORMAL HIGH (ref 65–99)

## 2016-02-29 MED ORDER — INSULIN ASPART 100 UNIT/ML ~~LOC~~ SOLN: 0.0000 [IU] | Freq: Every day | SUBCUTANEOUS | Status: DC

## 2016-02-29 MED ORDER — INFLUENZA VAC SPLIT QUAD 0.5 ML IM SUSY
0.5000 mL | PREFILLED_SYRINGE | INTRAMUSCULAR | Status: AC
  Administered 2016-03-01: 0.5 mL via INTRAMUSCULAR

## 2016-02-29 MED ORDER — DARBEPOETIN ALFA 100 MCG/0.5ML IJ SOSY
100.0000 ug | PREFILLED_SYRINGE | INTRAMUSCULAR | Status: DC
  Administered 2016-03-07: 100 ug via INTRAVENOUS
  Filled 2016-02-29: qty 0.5

## 2016-02-29 MED ORDER — GUAIFENESIN-DM 100-10 MG/5ML PO SYRP: 5.0000 mL | ORAL_SOLUTION | Freq: Four times a day (QID) | ORAL | Status: DC | PRN

## 2016-02-29 MED ORDER — ATORVASTATIN CALCIUM 40 MG PO TABS
40.0000 mg | ORAL_TABLET | Freq: Every day | ORAL | Status: DC
  Administered 2016-02-29 – 2016-03-08 (×8): 40 mg via ORAL
  Filled 2016-02-29 (×8): qty 1

## 2016-02-29 MED ORDER — DIPHENHYDRAMINE HCL 12.5 MG/5ML PO ELIX: 12.5000 mg | ORAL_SOLUTION | Freq: Four times a day (QID) | ORAL | Status: DC | PRN

## 2016-02-29 MED ORDER — POLYETHYLENE GLYCOL 3350 17 G PO PACK
17.0000 g | PACK | Freq: Two times a day (BID) | ORAL | Status: DC
Start: 2016-02-29 — End: 2016-03-09
  Administered 2016-02-29 – 2016-03-08 (×14): 17 g via ORAL
  Filled 2016-02-29 (×16): qty 1

## 2016-02-29 MED ORDER — OXYCODONE HCL 5 MG PO TABS
5.0000 mg | ORAL_TABLET | Freq: Three times a day (TID) | ORAL | Status: DC
  Administered 2016-02-29 – 2016-03-01 (×3): 5 mg via ORAL
  Filled 2016-02-29 (×3): qty 1

## 2016-02-29 MED ORDER — VITAMIN D 1000 UNITS PO TABS
1000.0000 [IU] | ORAL_TABLET | Freq: Every day | ORAL | Status: DC
  Administered 2016-03-01 – 2016-03-09 (×9): 1000 [IU] via ORAL
  Filled 2016-02-29 (×9): qty 1

## 2016-02-29 MED ORDER — TAMSULOSIN HCL 0.4 MG PO CAPS
0.4000 mg | ORAL_CAPSULE | Freq: Every day | ORAL | Status: DC
  Administered 2016-02-29 – 2016-03-08 (×8): 0.4 mg via ORAL
  Filled 2016-02-29 (×8): qty 1

## 2016-02-29 MED ORDER — ALUMINUM HYDROXIDE GEL 320 MG/5ML PO SUSP
15.0000 mL | ORAL | Status: DC | PRN
  Filled 2016-02-29: qty 30

## 2016-02-29 MED ORDER — GENTAMICIN SULFATE 0.1 % EX OINT
TOPICAL_OINTMENT | Freq: Three times a day (TID) | CUTANEOUS | Status: DC
  Administered 2016-03-01: 12:00:00 via TOPICAL
  Administered 2016-03-03: 1 via TOPICAL
  Administered 2016-03-03 (×2): via TOPICAL
  Administered 2016-03-04: 1 via TOPICAL
  Administered 2016-03-04 (×2): via TOPICAL
  Administered 2016-03-05 (×2): 1 via TOPICAL
  Administered 2016-03-05 – 2016-03-07 (×5): via TOPICAL
  Administered 2016-03-08: 1 via TOPICAL
  Administered 2016-03-08 (×2): via TOPICAL
  Filled 2016-02-29 (×4): qty 15

## 2016-02-29 MED ORDER — TRAZODONE HCL 50 MG PO TABS: 25.0000 mg | ORAL_TABLET | Freq: Every evening | ORAL | Status: DC | PRN

## 2016-02-29 MED ORDER — PROCHLORPERAZINE MALEATE 5 MG PO TABS: 5.0000 mg | ORAL_TABLET | Freq: Four times a day (QID) | ORAL | Status: DC | PRN

## 2016-02-29 MED ORDER — FERROUS SULFATE 325 (65 FE) MG PO TABS
325.0000 mg | ORAL_TABLET | Freq: Two times a day (BID) | ORAL | Status: DC
  Administered 2016-03-01 – 2016-03-02 (×3): 325 mg via ORAL
  Filled 2016-02-29 (×3): qty 1

## 2016-02-29 MED ORDER — INSULIN ASPART 100 UNIT/ML ~~LOC~~ SOLN
0.0000 [IU] | Freq: Three times a day (TID) | SUBCUTANEOUS | Status: DC
  Administered 2016-03-01 – 2016-03-06 (×3): 1 [IU] via SUBCUTANEOUS
  Administered 2016-03-08: 2 [IU] via SUBCUTANEOUS

## 2016-02-29 MED ORDER — ACETAMINOPHEN 325 MG PO TABS: 325.0000 mg | ORAL_TABLET | ORAL | Status: DC | PRN

## 2016-02-29 MED ORDER — ALLOPURINOL 100 MG PO TABS
100.0000 mg | ORAL_TABLET | Freq: Every day | ORAL | Status: DC
  Administered 2016-03-01 – 2016-03-09 (×9): 100 mg via ORAL
  Filled 2016-02-29 (×9): qty 1

## 2016-02-29 MED ORDER — PROCHLORPERAZINE EDISYLATE 5 MG/ML IJ SOLN: 5.0000 mg | Freq: Four times a day (QID) | INTRAMUSCULAR | Status: DC | PRN

## 2016-02-29 MED ORDER — INSULIN GLARGINE 100 UNIT/ML ~~LOC~~ SOLN
16.0000 [IU] | Freq: Two times a day (BID) | SUBCUTANEOUS | Status: DC
  Administered 2016-02-29 – 2016-03-05 (×11): 16 [IU] via SUBCUTANEOUS
  Filled 2016-02-29 (×14): qty 0.16

## 2016-02-29 MED ORDER — CARVEDILOL 6.25 MG PO TABS
18.7500 mg | ORAL_TABLET | Freq: Two times a day (BID) | ORAL | Status: DC
  Administered 2016-02-29 – 2016-03-09 (×17): 18.75 mg via ORAL
  Filled 2016-02-29 (×17): qty 1

## 2016-02-29 MED ORDER — BISACODYL 10 MG RE SUPP
10.0000 mg | Freq: Every day | RECTAL | Status: DC | PRN
  Administered 2016-03-01: 10 mg via RECTAL

## 2016-02-29 MED ORDER — GABAPENTIN 100 MG PO CAPS
100.0000 mg | ORAL_CAPSULE | Freq: Three times a day (TID) | ORAL | Status: DC
  Administered 2016-02-29 – 2016-03-03 (×7): 100 mg via ORAL
  Filled 2016-02-29 (×7): qty 1

## 2016-02-29 MED ORDER — SEVELAMER CARBONATE 800 MG PO TABS
800.0000 mg | ORAL_TABLET | Freq: Three times a day (TID) | ORAL | Status: DC
  Administered 2016-02-29 – 2016-03-02 (×6): 800 mg via ORAL
  Filled 2016-02-29 (×6): qty 1

## 2016-02-29 MED ORDER — CINACALCET HCL 30 MG PO TABS
60.0000 mg | ORAL_TABLET | Freq: Every day | ORAL | Status: DC
Start: 2016-02-29 — End: 2016-03-09
  Administered 2016-03-01 – 2016-03-08 (×6): 60 mg via ORAL
  Filled 2016-02-29 (×6): qty 2

## 2016-02-29 MED ORDER — TORSEMIDE 20 MG PO TABS
20.0000 mg | ORAL_TABLET | Freq: Every day | ORAL | Status: DC
  Administered 2016-03-01 – 2016-03-02 (×2): 20 mg via ORAL
  Filled 2016-02-29 (×2): qty 1

## 2016-02-29 MED ORDER — PROCHLORPERAZINE 25 MG RE SUPP: 12.5000 mg | Freq: Four times a day (QID) | RECTAL | Status: DC | PRN

## 2016-02-29 MED ORDER — HEPARIN SODIUM (PORCINE) 5000 UNIT/ML IJ SOLN
5000.0000 [IU] | Freq: Three times a day (TID) | INTRAMUSCULAR | Status: DC
  Administered 2016-02-29 – 2016-03-09 (×23): 5000 [IU] via SUBCUTANEOUS
  Filled 2016-02-29 (×25): qty 1

## 2016-02-29 MED ORDER — CALCIUM ACETATE (PHOS BINDER) 667 MG PO CAPS
1334.0000 mg | ORAL_CAPSULE | Freq: Three times a day (TID) | ORAL | Status: DC
  Administered 2016-03-01 – 2016-03-02 (×5): 1334 mg via ORAL
  Filled 2016-02-29 (×8): qty 2

## 2016-02-29 MED ORDER — MODAFINIL 100 MG PO TABS
100.0000 mg | ORAL_TABLET | Freq: Every day | ORAL | Status: DC
  Administered 2016-03-01 – 2016-03-09 (×10): 100 mg via ORAL
  Filled 2016-02-29 (×9): qty 1

## 2016-02-29 NOTE — H&P (Signed)
Physical Medicine and Rehabilitation Admission H&P    CC: Subdural hemorrhage.     HPI: [name] [name] is a 55 year old male with history of T2DM with neuropathy and nephropathy, polycystic kidney, ESRD-HD dependent, gout, HTN, chronic low back pain who was originally admitted to [facility] 01/20/16 with uncontrollable shaking after reports of fall with head trauma on 9/19. History taken from chart review. CT head done showing acute SDH along flax without compression. Hospital course complicated by cardiac arrest 10/1 due to aspiration event and required CPR with ROSC and intubated thorough 10/12.  Respiratory status stable post extubation but with complaint of rib, sternal and right shoulder pain. He completed treatment for MSSA aspiration PNA but was noted to be encephalopathic.  `MRI brain without acute abnormality. EEG negative for seizures. He was transferred to [facility] on 10/19 for medical management and therapy. Antibiotics narrowed to rocephin. He developed C diff colitis on 10/20 and has completed treatment.  R-AVF thrombolysis performed on 10/25 with return of flow and HD ongoing.  Has been transfuse with one unit PRBC for anemia of chronic disease.    Review of Systems  HENT: Negative for hearing loss and tinnitus.   Eyes: Negative for blurred vision and double vision.  Respiratory: Negative for cough and shortness of breath.   Cardiovascular: Negative for chest pain and palpitations.  Gastrointestinal: Positive for abdominal pain (RLQ for past couple of months?) and constipation. Negative for heartburn and nausea.  Genitourinary: Positive for urgency (at times). Negative for dysuria.  Musculoskeletal: Positive for back pain, joint pain and myalgias.  Skin: Negative for itching and rash.  Neurological: Positive for sensory change and weakness. Negative for focal weakness.  Psychiatric/Behavioral: Negative for depression and memory loss. The patient is not  nervous/anxious.   All other systems reviewed and are negative.     Past Medical History:  Diagnosis Date  . Chronic back pain   . Chronic pain of lower extremity, bilateral   . DDD (degenerative disc disease), lumbar   . Diabetes ([place])   . ESRD on dialysis ([place])   . Gout   . Hyperlipidemia   . Neuropathy ([place])   . OA (osteoarthritis) of knee   . OSA on CPAP   . Peritonitis ([place])   . Polycystic kidney   . Uric acid kidney stone      Past Surgical History:  Procedure Laterality Date  . PERIPHERAL VASCULAR CATHETERIZATION Right 02/15/2016   Procedure: Fistulagram;  Surgeon: Waynetta Sandy, MD;  Location: [place] [facility];  Service: Cardiovascular;  Laterality: Right;  upper arm  . PERIPHERAL VASCULAR CATHETERIZATION Right 02/15/2016   Procedure: Peripheral Vascular Balloon Angioplasty;  Surgeon: Waynetta Sandy, MD;  Location: [place] [facility];  Service: Cardiovascular;  Laterality: Right;  upper arm venous    Family History  Problem Relation Age of Onset  . Polycystic kidney disease Father   . Polycystic kidney disease Brother   . Diabetes Mother   . Polycystic kidney disease Sister       Social History: Married. Used to work as a Patent attorney. Disabled since 2015 due to dialysis. Used to smoke 2 PPD--quit 04/2010.   Never used smokeless tobacco. Does not use alcohol or illicit drugs.     Allergies  Allergen Reactions  . Iodinated Diagnostic Agents Other (See Comments)    Pt sts that his skin turns purple and his eyes bleed.  . Iodine Anaphylaxis    IVP Dye  .  Sulfa Antibiotics Anaphylaxis, Hives and Other (See Comments)  . Morphine Rash    Medications Prior to Admission  Medication Sig Dispense Refill  . calcitRIOL (ROCALTROL) 0.25 MCG capsule Take by mouth.    . carvedilol (COREG) 12.5 MG tablet Take 12.5 mg by mouth.    . Cholecalciferol (VITAMIN D-1000 MAX ST) 1000 units tablet Take by mouth.    . cinacalcet (SENSIPAR) 30 MG  tablet Take 60 mg by mouth.    . doxazosin (CARDURA) 4 MG tablet Take 4 mg by mouth.    . gabapentin (NEURONTIN) 300 MG capsule Take 300 mg by mouth.      Home: Home Living Living Arrangements: Spouse/significant other Available Help at Discharge: Family, Available PRN/intermittently Type of Home: House Home Access: Stairs to enter [employer] of Steps: 2 steps to porch then small step up from porch. Home Layout: One level  Lives With: Spouse   Functional History: Prior Function Level of Independence: Independent  Functional Status:  Mobility:          ADL:    Cognition:      Physical Exam: Height '5\' 9"'  (1.753 m), weight 115.2 kg (254 lb). Physical Exam  Nursing note and vitals reviewed. Constitutional: He is oriented to person, place, and time. He appears well-developed and well-nourished. Nasal cannula in place.  Obese male--NAD.  HENT:  Head: Normocephalic and atraumatic.  Mouth/Throat: Oropharynx is clear and moist.  Eyes: Conjunctivae and EOM are normal. Pupils are equal, round, and reactive to light.  Neck: Normal range of motion. Neck supple.  Cardiovascular: Normal rate and regular rhythm.   Murmur heard. Respiratory: Effort normal and breath sounds normal. No stridor. No respiratory distress. He has no wheezes.  +[place]  GI: Soft. Bowel sounds are normal. He exhibits no distension. There is tenderness (non-specific tenderness RLQ).  Peritoneal dialysis catheter in place with dry dressing.   Musculoskeletal: He exhibits no edema or tenderness.  Neurological: He is alert and oriented to person, place, and time. No cranial nerve deficit.  Motor: 4/5 grossly throughout  Skin: Skin is warm and dry.  Psychiatric: He has a normal mood and affect. His behavior is normal. Thought content normal.    Results for orders placed or performed during the hospital encounter of 02/15/16 (from the past 48 hour(s))  CBC     Status: Abnormal   Collection Time:  02/29/16  8:19 AM  Result Value Ref Range   WBC 6.7 4.0 - 10.5 K/uL   RBC 2.93 (L) 4.22 - 5.81 MIL/uL   Hemoglobin 8.0 (L) 13.0 - 17.0 g/dL   HCT 26.0 (L) 39.0 - 52.0 %   MCV 88.7 78.0 - 100.0 fL   MCH 27.3 26.0 - 34.0 pg   MCHC 30.8 30.0 - 36.0 g/dL   RDW 15.6 (H) 11.5 - 15.5 %   Platelets 211 150 - 400 K/uL  Renal function panel     Status: Abnormal   Collection Time: 02/29/16  8:19 AM  Result Value Ref Range   Sodium 138 135 - 145 mmol/L   Potassium 4.0 3.5 - 5.1 mmol/L   Chloride 100 (L) 101 - 111 mmol/L   CO2 27 22 - 32 mmol/L   Glucose, Bld 90 65 - 99 mg/dL   BUN 29 (H) 6 - 20 mg/dL   Creatinine, Ser 5.24 (H) 0.61 - 1.24 mg/dL   Calcium 9.4 8.9 - 10.3 mg/dL   Phosphorus 3.9 2.5 - 4.6 mg/dL   Albumin 2.3 (L) 3.5 -  5.0 g/dL   GFR calc non Af Amer 11 (L) >60 mL/min   GFR calc Af Amer 13 (L) >60 mL/min    Comment: (NOTE) The eGFR has been calculated using the CKD EPI equation. This calculation has not been validated in all clinical situations. eGFR's persistently <60 mL/min signify possible Chronic Kidney Disease.    Anion gap 11 5 - 15   No results found.     Medical Problem List and Plan: 1.  Weakness, mobility deficits, difficulty completing ADLs secondary to TBI and multimedical. 2.  DVT Prophylaxis/Anticoagulation: Pharmaceutical: Lovenox 3. Pain Management: On Gabapentin tid to help manage neuropathy BLE. Will continue schedule oxycodone 5 mg tid for now for sternal/chest pain/back pain. Used oxycodone 10 mg daily at home.  4. Mood: Motivated to get better and get home. LCSW to follow for evaluation and support.  5. Neuropsych: This patient is capable of making decisions on his own behalf. 6. Skin/Wound Care: Routine pressure relief measures.  7. Fluids/Electrolytes/Nutrition:  Strict I/O. Check daily weights. 1200 cc FR/day. Renal function with HD 8. T2DM with nerupathy: Monitor BS ac/hs. Continue lantus bid with SSI for elevated BS. Titrate insulin as  indicated.  9. OSA: Resume CPAP and wean off oxygen. 10. ESRD: Schedule therapy earlier in the day and HD MWF later in day to help with tolerance of activity.  11. Gout: Managed on allopurinol. 12. Anemia of chronic disease: Monitor serially and transfuse if needed. On aranesp weekly as well as iron for supplement.  13. HTN: Monitor BID 14. Morbid Obesity:  Body mass index is 37.5 kg/m.  Diet and exercise education  Will cont to encourage weight loss to increase endurance and promote overall health   Post Admission Physician Evaluation: 1. Preadmission assessment reviewed and changes made below. 2. Functional deficits secondary  to TBI and multimedical. 3. Patient is admitted to receive collaborative, interdisciplinary care between the physiatrist, rehab nursing staff, and therapy team. 4. Patient's level of medical complexity and substantial therapy needs in context of that medical necessity cannot be provided at a lesser intensity of care such as a SNF. 5. Patient has experienced substantial functional loss from his/her baseline which was documented above under the "Functional History" and "Functional Status" headings.  Judging by the patient's diagnosis, physical exam, and functional history, the patient has potential for functional progress which will result in measurable gains while on inpatient rehab.  These gains will be of substantial and practical use upon discharge  in facilitating mobility and self-care at the household level. 51. Physiatrist will provide 24 hour management of medical needs as well as oversight of the therapy plan/treatment and provide guidance as appropriate regarding the interaction of the two. 7. 24 hour rehab nursing will assist with bladder management, safety, skin/wound care, pain management and patient education  and help integrate therapy concepts, techniques,education, etc. 8. PT will assess and treat for/with: Lower extremity strength, range of motion,  stamina, balance, functional mobility, safety, adaptive techniques and equipment, woundcare, coping skills, pain control, brain injury education.   Goals are: Supervision/Min A. 9. OT will assess and treat for/with: ADL's, functional mobility, safety, upper extremity strength, adaptive techniques and equipment, wound mgt, ego support, and community reintegration.   Goals are: Supervision/Min A. Therapy may proceed with showering this patient. 10. SLP will assess and treat for/with: cognition.  Goals are: Mod I. 11. Case Management and Social Worker will assess and treat for psychological issues and discharge planning. 12. Team conference will be held  weekly to assess progress toward goals and to determine barriers to discharge. 13. Patient will receive at least 3 hours of therapy per day at least 5 days per week. 14. ELOS: 16-19 days.       15. Prognosis:  good  Delice Lesch, MD, Mellody Drown 02/29/2016

## 2016-02-29 NOTE — Progress Notes (Signed)
Ankit [name], MD Physician Signed Physical Medicine and Rehabilitation  PMR Pre-admission Date of Service: 02/28/2016 4:50 PM  Related encounter: Admission (Current) from 02/15/2016 in [facility]       _0 Hide copied text _1 Hover for attribution information   Secondary Market PMR Admission Coordinator Pre-Admission Assessment  Patient: [name] [name] is an 55 y.o., male MRN: [id number] DOB: July 23, 1960 Height: _2  (175.3 cm) Weight: 115.2 kg (254 lb)  Insurance Information HMO: No    PPO:       PCP:       IPA:       80/20:       OTHER:   PRIMARY:  Medicare A/B      Policy#: [id number]      Subscriber: [name] [name] Name:        Phone#:       Fax#:   Pre-Cert#:        Employer: Disabled Benefits:  Phone #:       Name: Checked in [place]. Date: 01/21/14     Deduct: $1316      Out of Pocket Max: none      Life Max: unlimited CIR: 100% and has 20 full hospital and 30 copay hospital days remaining     SNF: 100 days Outpatient: 80%     Co-Pay: 20% Home Health: 100%      Co-Pay: none DME: 80%     Co-Pay: 20% Providers: patient's choice  SECONDARY: [employer] of [place]      Policy#: [id number]      Subscriber: [name] [name] Name:        Phone#:       Fax#:   Pre-Cert#:        Employer:  Disabled Benefits:  Phone #: [phone]     Name:   Eff. Date:       Deduct:        Out of Pocket Max:        Life Max:   CIR:        SNF:   Outpatient:       Co-Pay:   Home Health:        Co-Pay:   DME:       Co-Pay:    Emergency Contact Information        Contact Information    Name Relation Home Work Mobile   [place] Spouse [phone]  [phone]      Current Medical History  Patient Admitting Diagnosis: Fall with SDH, cardiac arrest, VDRF . History of Present Illness:  A 55 yo male presented initially to [facility] and then transferred to [facility] with uncontrollable shaking, hematuria and mechanical fall with head trauma and  loss of consciousness on 01/10/16.  Head CT showed acute subdural hemorrhage.  While at [facility], patient had a cardiac arrest, required CPR, was intubated and was treated for aspiration pneumonia.  Patient was extubaed on 02/02/16.  The patient suffered rib fracutes and sternal fracture post CPR.  Patient was admitted to [facility] on 02/09/16.  He is on enteric precautions for C-Diff and contact precautions for MSSA.  Patient is currently on a dysphagia 3 diet with thin liquids.  He has 02 at 2.5 L [place].  He is receiving HD M-W-F.  PT/OT evaluations were completed with ongoing treatments.  Inpatient rehab was recommended by therapies.  Patient's medical record from [facility] has been reviewed by the rehabilitation admission coordinator and physician.  Past Medical  History  Neuropathy secondary to diabetes Status post renal calculi removal CKD ESRD M/W/F Hyperlipidemia Hypertension Gout Obesity with obstructive sleep apnea  Family History   family history is not on file.  Prior Rehab/Hospitalizations Has the patient had major surgery during 100 days prior to admission? No              Current Medications See MAR from [facility]  Patients Current Diet:  Dysphagia 3, Renal diet with thin liquids  Precautions / Restrictions Precautions Precautions: Fall   Has the patient had 2 or more falls or a fall with injury in the past year?Yes.  Patient reports at least 5 falls with the last fall resulting in rib injury with bruising, damage to peritoneal dialysis catheter, and blood in his urine.  Prior Activity Level Community (5-7x/wk): Went out daily, was driving  Prior Functional Level Self Care: Did the patient need help bathing, dressing, using the toilet or eating?  Independent  Indoor Mobility: Did the patient need assistance with walking from room to room (with or without device)? Independent  Stairs: Did the patient need  assistance with internal or external stairs (with or without device)? Independent  Functional Cognition: Did the patient need help planning regular tasks such as shopping or remembering to take medications? Independent  Home Assistive Devices / Equipment Home Assistive Devices/Equipment: CPAP  Prior Device Use: Indicate devices/aids used by the patient prior to current illness, exacerbation or injury? None   Prior Functional Level Current Functional Level  Bed Mobility Independent Min assist  Transfers Independent Mod assist  Mobility - Walk/Wheelchair Independent Min assist (Knee buckles causing mod assist needed.)  Upper Body Dressing Independent Mod assist  Lower Body Dressing Independent Max assist  Grooming Independent Min assist  Eating/Drinking Independent Other (set up feeding)  Toilet Transfer Independent Max assist  Bladder Continence  WDL Using urinal  Bowel Management WDL Last BM 02/28/16  Stair Climbing Independent Total assist  Communication Intact and verbal Verbal and intact  Memory Intact Alert and oriented  Cooking/Meal Prep Independent     Housework Independent   Money Management Independent   Driving Independent     Special needs/care consideration BiPAP/CPAP Yes, has CPAP at home for the past 10 years CPM No Continuous Drip IV NO Dialysis Yes        Days M-W-F Life Vest No Oxygen Yes, on 02 2.5 L [place] Special Bed No Trach Size No Wound Vac (area) No      Skin No                               Bowel mgmt: Last BM 02/28/16 Bladder mgmt: Voiding in urinal Diabetic mgmt Yes, on insulin at home  Previous Home Environment Living Arrangements: Spouse/significant other  Lives With: Spouse Available Help at Discharge: Family, Available PRN/intermittently Type of Home: House Home Layout: One level Home Access: Stairs to enter [employer] of Steps: 2 steps to porch then small step up from porch.  Discharge Living Setting Plans for  Discharge Living Setting: Patient's home, House, Lives with (comment) (Lives with wife.) Type of Home at Discharge: House Discharge Home Layout: One level Discharge Home Access: Stairs to enter Entrance Stairs-Number of Steps: 2-3 step entry Does the patient have any problems obtaining your medications?: No  Social/Family/Support Systems Patient Roles: Spouse, Parent (Has a wife, a son and a daughter.) Contact Information: [name] [name] - wife Anticipated Caregiver: wife  and inlaws Anticipated Caregiver's Contact Information: [name] [name] - wife - (h) [phone] (c) [phone] Ability/Limitations of Caregiver: Wife works 8 am to 5 pm.  Patient can go to inlaws during the day as needed. Caregiver Availability: Intermittent Discharge Plan Discussed with Primary Caregiver: Yes Is Caregiver In Agreement with Plan?: Yes Does Caregiver/Family have Issues with Lodging/Transportation while Pt is in Rehab?: No  Goals/Additional Needs Patient/Family Goal for Rehab: PT/OT mod I and supervision goals, SLP mod I and I goals Expected length of stay: 10-14 days Cultural Considerations: None Dietary Needs: Dys 3, thin liquids Equipment Needs: TBD.  Patient would love to have a power wheel chair. Special Service Needs: HD M-W-F (was on PD prior to hospitalization) Pt/Family Agrees to Admission and willing to participate: Yes Program Orientation Provided & Reviewed with Pt/Caregiver Including Roles  & Responsibilities: Yes  Patient Condition: I met with patient at his bedside on [facility] and reviewed his medical record.  He has suffered multiple medical issues over the past 2 months.  He is making steady progress and can tolerate and benefit from 3 hours of therapy a day.  He needs the coordination of the rehab MD and team to regain his function as quickly as possible.  I have shared all information with the rehab MD and have approval for acute inpatient rehab admission for today.  Preadmission Screen  Completed By:  Retta Diones, 02/29/2016 2:24 PM ______________________________________________________________________   Discussed status with Dr. Posey Pronto on 02/29/16 at 1425 and received telephone approval for admission today.  Admission Coordinator:  Retta Diones, time 1425/Date 02/29/16   Assessment/Plan: Diagnosis: SDH, cardiac arrest, VDRF  1. Does the need for close, 24 hr/day  Medical supervision in concert with the patient's rehab needs make it unreasonable for this patient to be served in a less intensive setting? Yes  2. Co-Morbidities requiring supervision/potential complications: Neuropathy secondary to diabetes, ESRD, Hyperlipidemia, Hypertension, Gout, Obesity with obstructive sleep apnea 3. Due to bladder management, safety, skin/wound care, disease management, medication administration and patient education, does the patient require 24 hr/day rehab nursing? Yes 4. Does the patient require coordinated care of a physician, rehab nurse, PT (1-2 hrs/day, 5 days/week) and OT (1-2 hrs/day, 5 days/week) to address physical and functional deficits in the context of the above medical diagnosis(es)? Yes Addressing deficits in the following areas: balance, endurance, locomotion, strength, transferring, bathing, dressing, toileting and psychosocial support 5. Can the patient actively participate in an intensive therapy program of at least 3 hrs of therapy 5 days a week? Yes 6. The potential for patient to make measurable gains while on inpatient rehab is excellent 7. Anticipated functional outcomes upon discharge from inpatients are: supervision and min assist PT, supervision and min assist OT, n/a SLP 8. Estimated rehab length of stay to reach the above functional goals is: 14-19 days. 9. Does the patient have adequate social supports to accommodate these discharge functional goals? Yes 10. Anticipated D/C setting: Home 11. Anticipated post D/C treatments: HH therapy and Home  excercise program 12. Overall Rehab/Functional Prognosis: good    RECOMMENDATIONS: This patient's condition is appropriate for continued rehabilitative care in the following setting: CIR Patient has agreed to participate in recommended program. Yes Note that insurance prior authorization may be required for reimbursement for recommended care.  Retta Diones 02/29/2016  Delice Lesch, MD, Mellody Drown 02/29/16    Revision History

## 2016-02-29 NOTE — Progress Notes (Signed)
  Subjective:  Patient  underwent hemodialysis today. UF 475 cc  Objective:  Vital signs in last 24 hours:  Temperature 98.1 pulse 75  blood pressure 116/67  Physical Exam: General: NAD, laying in the bed  HEENT Anicteric  Neck supple  Pulm/lungs Clear b/l, normal effort  CVS/Heart No rub S1S2  Abdomen:  Soft, distended, PD catheter in place  Extremities: Trace peripheral edema, rt arm edema distal to access  Neurologic: Alert, oriented x3  Skin: No acute rashes  Access: Rt upper arm AVF       Basic Metabolic Panel:   Recent Labs Lab 02/24/16 0407 02/27/16 0814 02/29/16 0819  NA 137 138 138  K 4.5 5.2* 4.0  CL 97* 99* 100*  CO2 27 27 27   GLUCOSE 121* 126* 90  BUN 62* 62* 29*  CREATININE 8.16* 9.11* 5.24*  CALCIUM 9.9 10.4* 9.4  PHOS 6.1* 6.7* 3.9     CBC:  Recent Labs Lab 02/23/16 0756 02/24/16 0407 02/27/16 0814 02/29/16 0819  WBC 8.9 10.0 7.9 6.7  HGB 8.2* 7.8* 8.3* 8.0*  HCT 26.9* 25.2* 27.6* 26.0*  MCV 90.0 88.7 89.6 88.7  PLT 259 257 229 211      Microbiology:  Recent Results (from the past 720 hour(s))  C difficile quick scan w PCR reflex     Status: Abnormal   Collection Time: 02/10/16 10:13 AM  Result Value Ref Range Status   C Diff antigen POSITIVE (A) NEGATIVE Final   C Diff toxin NEGATIVE NEGATIVE Final   C Diff interpretation Results are indeterminate. See PCR results.  Final  Clostridium Difficile by PCR     Status: Abnormal   Collection Time: 02/10/16 10:13 AM  Result Value Ref Range Status   Toxigenic C Difficile by pcr POSITIVE (A) NEGATIVE Final    Comment: Positive for toxigenic C. difficile with little to no toxin production. Only treat if clinical presentation suggests symptomatic illness.  Culture, blood (single)     Status: None   Collection Time: 02/13/16  9:30 AM  Result Value Ref Range Status   Specimen Description BLOOD  RIGHT FISTULA  Final   Special Requests BOTTLES DRAWN AEROBIC AND ANAEROBIC  10CC  Final   Culture NO GROWTH 5 DAYS  Final   Report Status 02/18/2016 FINAL  Final    Coagulation Studies: No results for input(s): LABPROT, INR in the last 72 hours.  Urinalysis: No results for input(s): COLORURINE, LABSPEC, PHURINE, GLUCOSEU, HGBUR, BILIRUBINUR, KETONESUR, PROTEINUR, UROBILINOGEN, NITRITE, LEUKOCYTESUR in the last 72 hours.  Invalid input(s): APPERANCEUR    Imaging: No results found.   Medications:       Assessment/ Plan:  55 y.o. caucasian male with  medical problems of end-stage renal disease started hemodialysis 2015, then switch to peritoneal dialysis in July 2016, diabetes, hyperlipidemia, gouty arthritis, polycystic kidney disease, hypertension, obstructive sleep apnea, chronic back pain from arthritis, who [facility]   1. End-stage renal disease Dialysis schedule M-W-F Shifting to rehab today  2. Anemia of chronic kidney disease Hemoglobin currently 8.0.  Patient recently received blood transfusion.  Also continue Aranesp 100 mcg daily.   3. Secondary hyperparathyroidism Phosphorus  3.9.  Continue sevelamer as well as PhosLo.  4. MSSA infection Antibiotics as per primary team  5 .Hyperkelamia  Continue to monitor.    LOS: 0 [name] [name] 11/8/20173:58 PM

## 2016-03-01 ENCOUNTER — Inpatient Hospital Stay (HOSPITAL_COMMUNITY): Payer: Medicare Other | Admitting: Speech Pathology

## 2016-03-01 ENCOUNTER — Inpatient Hospital Stay (HOSPITAL_COMMUNITY): Payer: Medicare Other | Admitting: Occupational Therapy

## 2016-03-01 ENCOUNTER — Inpatient Hospital Stay (HOSPITAL_COMMUNITY): Payer: Medicare Other | Admitting: Physical Therapy

## 2016-03-01 DIAGNOSIS — S065X2S Traumatic subdural hemorrhage with loss of consciousness of 31 minutes to 59 minutes, sequela: Secondary | ICD-10-CM

## 2016-03-01 LAB — GLUCOSE, CAPILLARY
GLUCOSE-CAPILLARY: 127 mg/dL — AB (ref 65–99)
GLUCOSE-CAPILLARY: 107 mg/dL — AB (ref 65–99)
Glucose-Capillary: 88 mg/dL (ref 65–99)
Glucose-Capillary: 127 mg/dL — ABNORMAL HIGH (ref 65–99)

## 2016-03-01 MED ORDER — OXYCODONE HCL 5 MG PO TABS
5.0000 mg | ORAL_TABLET | Freq: Three times a day (TID) | ORAL | Status: DC | PRN
  Administered 2016-03-02 – 2016-03-08 (×6): 10 mg via ORAL
  Filled 2016-03-01 (×6): qty 2

## 2016-03-01 MED ORDER — OXYCODONE HCL 5 MG PO TABS: 5.0000 mg | ORAL_TABLET | Freq: Three times a day (TID) | ORAL | Status: DC

## 2016-03-01 NOTE — Progress Notes (Signed)
Social Work Assessment and Plan Social Work Assessment and Plan  Patient Details  Name: [name] [name] MRN: [id number] Date of Birth: 1960-12-07  Today's Date: 03/01/2016  Problem List:  Patient Active Problem List   Diagnosis Date Noted  . SDH (subdural hematoma) (HCC) 02/29/2016  . Traumatic subdural hemorrhage (HCC) 02/29/2016  . History of cardiac arrest   . Type 2 diabetes mellitus with peripheral neuropathy (HCC)   . ESRD on dialysis (HCC)   . OSA (obstructive sleep apnea)   . History of gout   . Anemia of chronic disease   . Essential hypertension   . Benign essential HTN   . Morbid obesity (HCC)    Past Medical History:  Past Medical History:  Diagnosis Date  . Chronic back pain   . Chronic pain of lower extremity, bilateral   . DDD (degenerative disc disease), lumbar   . Diabetes (HCC)   . ESRD on dialysis (HCC)   . Gout   . Hyperlipidemia   . Neuropathy (HCC)   . OA (osteoarthritis) of knee   . OSA on CPAP   . Peritonitis (HCC)   . Polycystic kidney   . Uric acid kidney stone    Past Surgical History:  Past Surgical History:  Procedure Laterality Date  . PERIPHERAL VASCULAR CATHETERIZATION Right 02/15/2016   Procedure: Fistulagram;  Surgeon: Maeola HarmanBrandon Christopher Cain, MD;  [facility];  Service: Cardiovascular;  Laterality: Right;  upper arm  . PERIPHERAL VASCULAR CATHETERIZATION Right 02/15/2016   Procedure: Peripheral Vascular Balloon Angioplasty;  Surgeon: Maeola HarmanBrandon Christopher Cain, MD;  [facility];  Service: Cardiovascular;  Laterality: Right;  upper arm venous   Social History:  has no tobacco, alcohol, and drug history on file.  Family / Support Systems Marital Status: Married Patient Roles: Parent, Spouse Spouse/Significant Other: Lupita LeashDonna [phone]  [phone] Children: daughter and son Other Supports: In-laws  Anticipated Caregiver: Wife and in-laws Ability/Limitations of Caregiver: Wife works 8-5 pm  and if needs to can go to in-laws home while she is working Medical laboratory scientific officerCaregiver Availability: Evenings only Family Dynamics: Close knit family who will be there for him if needed. He is hoping he can be alone during the day so he can be home instead of his in-laws home. He has been on PD for 11 months and it works so much better than HD.  Social History Preferred language: English Religion: Unknown Cultural Background: No issues Education: High School Read: Yes Write: Yes Employment Status: Disabled Fish farm managerLegal Hisotry/Current Legal Issues: No issues Guardian/Conservator: None-according to MD pt is capable of mkaing his own decisions while here.   Abuse/Neglect Physical Abuse: Denies Verbal Abuse: Denies Sexual Abuse: Denies Exploitation of patient/patient's resources: Denies Self-Neglect: Denies  Emotional Status Pt's affect, behavior adn adjustment status: Pt is motivated and can see his progress already in therapies. It has been a long road and he is hoping his rehab stay will be short and he can return home soon. He will do whatever the team asks him to do to improve. He has always been independent and wants to get back to this level. Recent Psychosocial Issues: multiple medical issues and long hospitalization Pyschiatric History: No issues-deferred depression screen due to pt appears to be coping appropriately and able to verbalize his concerns. He has a strong faith and utilizes this to cope, his pastor visits him here also. Substance Abuse History: No issues  Patient / Family Perceptions, Expectations & Goals Pt/Family understanding of illness & functional limitations:  Pt and wife can explain his health issues and treamtent plan. Thye both feel their questions and concerns have been addressed. Both are hopeful he will do well here and be home soon. Premorbid pt/family roles/activities: husband, father, uncle, dialysis pt, friend, church member, Chief Financial Officerhome owner, etc Anticipated changes in  roles/activities/participation: resume Pt/family expectations/goals: Pt states: " I want to get back to being independent again, eventually I will get there."  Wife states: " I will help but he is stubborn and will do it on his own."  [employer] Inc[employer] Resources Community Agencies: Other (Comment) (tried HD did not work for him) Premorbid Home Care/DME Agencies: None Transportation available at discharge: Family Resource referrals recommended: Support group (specify)  Discharge Planning Living Arrangements: Spouse/significant other Support Systems: Spouse/significant other, Children, Other relatives, Friends/neighbors, Psychologist, clinicalChurch/faith community Type of Residence: Private residence Insurance Resources: Harrah's EntertainmentMedicare, Media plannerrivate Insurance (specify) Herbalist(BCBS) Financial Resources: Family Support, SSD Financial Screen Referred: No Living Expenses: Own Money Management: Spouse, Patient Does the patient have any problems obtaining your medications?: No Home Management: Both he and wife Patient/Family Preliminary Plans: Return home with wife and go to in-laws if he can not stay alone during the day while wife works. Will await therapist goals. Wife will come in when needed but plans to work until the team needs her for training.  Social Work Anticipated Follow Up Needs: HH/OP, Support Group  Clinical Impression Pleasant gentleman who is motivated and willing to work in therapies to achieve his goals of mod/i, so he can stay alone while his wife works. He has done well and feels his main issue now is getting stronger. Will await team's evaluations and work on a safe discharge plan.  Lucy Chrisupree, Akila Batta G 03/01/2016, 11:38 AM

## 2016-03-01 NOTE — Evaluation (Signed)
Speech Language Pathology Assessment and Plan  Patient Details  Name: [name] [name] MRN: [id number] Date of Birth: 01-31-1961  SLP Diagnosis:  (n/a)  Rehab Potential:   ELOS:      Today's Date: 03/01/2016 SLP Individual Time: 0900-1000 SLP Individual Time Calculation (min): 60 min    Problem List: Patient Active Problem List   Diagnosis Date Noted  . SDH (subdural hematoma) ([place]) 02/29/2016  . Traumatic subdural hemorrhage ([place]) 02/29/2016  . History of cardiac arrest   . Type 2 diabetes mellitus with peripheral neuropathy ([place])   . ESRD on dialysis ([place])   . OSA (obstructive sleep apnea)   . History of gout   . Anemia of chronic disease   . Essential hypertension   . Benign essential HTN   . Morbid obesity ([place])    Past Medical History:  Past Medical History:  Diagnosis Date  . Chronic back pain   . Chronic pain of lower extremity, bilateral   . DDD (degenerative disc disease), lumbar   . Diabetes ([place])   . ESRD on dialysis ([place])   . Gout   . Hyperlipidemia   . Neuropathy ([place])   . OA (osteoarthritis) of knee   . OSA on CPAP   . Peritonitis ([place])   . Polycystic kidney   . Uric acid kidney stone    Past Surgical History:  Past Surgical History:  Procedure Laterality Date  . PERIPHERAL VASCULAR CATHETERIZATION Right 02/15/2016   Procedure: Fistulagram;  Surgeon: Waynetta Sandy, MD;  Location: [place] [facility];  Service: Cardiovascular;  Laterality: Right;  upper arm  . PERIPHERAL VASCULAR CATHETERIZATION Right 02/15/2016   Procedure: Peripheral Vascular Balloon Angioplasty;  Surgeon: Waynetta Sandy, MD;  Location: [place] [facility];  Service: Cardiovascular;  Laterality: Right;  upper arm venous    Assessment / Plan / Recommendation Clinical Impression A 55 yo male presented initially to [facility] and then transferred to [facility] with uncontrollable shaking, hematuria and mechanical fall with head trauma and loss of consciousness  on 01/10/16.  Head CT showed acute subdural hemorrhage.  While at [facility], patient had a cardiac arrest, required CPR, was intubated and was treated for aspiration pneumonia.  Patient was extubaed on 02/02/16.  The patient suffered rib fracutes and sternal fracture post CPR.  Patient was admitted to [facility] on 02/09/16.  He is on enteric precautions for C-Diff and contact precautions for MSSA.  Patient is currently on a dysphagia 3 diet with thin liquids.  He has 02 at 2.5 L [place].  He is receiving HD M-W-F.  PT/OT evaluations were completed with ongoing treatments.  Inpatient rehab was recommended by therapies. Pt participated in cognitive-linguistic evaluation using the Motreal Cognitive Assessment (MOCA 7.1).  Pt scored  29/30 possible points which is WNL. Pt performed WNL with functional informal assessments as well and does not endorse any ongoing difficulty. Swallow evaluation was deferred as the pt does not endorse any dysphagia and is on a regular diet.  Skilled Therapeutic Interventions          n/a  SLP Assessment  Patient does not need any further Speech Lanaguage Pathology Services    Recommendations  Follow up Recommendations: None Equipment Recommended: None recommended by SLP                     Pain Pain Assessment Pain Assessment: No/denies pain Pain Score: 8  Pain Type: Chronic pain Pain Location: Back Pain Onset: On-going Patients  Stated Pain Goal: 4 Pain Intervention(s): RN made aware Multiple Pain Sites: Yes 2nd Pain Site Pain Score: 8 Pain Type: Chronic pain Pain Location: Rib cage Pain Onset: On-going Patient's Stated Pain Goal: 4 Pain Intervention(s): RN made aware  Prior Functioning Cognitive/Linguistic Baseline: Within functional limits Type of Home: House  Lives With: Spouse Available Help at Discharge: Family;Available PRN/intermittently Education: highschool Vocation: On disability  Function:  Eating Eating   Modified  Consistency Diet: No Eating Assist Level: No help, No cues           Cognition Comprehension Comprehension assist level: Follows complex conversation/direction with no assist  Expression   Expression assist level: Expresses complex ideas: With no assist  Social Interaction Social Interaction assist level: Interacts appropriately with others - No medications needed.  Problem Solving Problem solving assist level: Solves complex problems: Recognizes & self-corrects  Memory Memory assist level: Complete Independence: No helper    Refer to Care Plan for Long Term Goals  Recommendations for other services: None  Discharge Criteria: Patient will be discharged from SLP if patient refuses treatment 3 consecutive times without medical reason, if treatment goals not met, if there is a change in medical status, if patient makes no progress towards goals or if patient is discharged from hospital.  The above assessment, treatment plan, treatment alternatives and goals were discussed and mutually agreed upon: by patient  Vinetta Bergamo MA, [place] 03/01/2016, 12:26 PM

## 2016-03-01 NOTE — Consult Note (Addendum)
[name] [name] Admit Date: 02/29/2016 03/01/2016 [name] [name], [name] [name] B Requesting Physician:  [name] KatzPatel MD  Reason for Consult:  ESRD Management HPI:  [id number] ESRD with [place]. [place]ren [place]nest [name] now in CIR following TBI.  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 Was prev at [facility] now in CIR.     Creatinine, Ser (mg/dL)  Date Value  16/10/960411/11/2015 5.24 (H)  02/27/2016 9.11 (H)  02/24/2016 8.16 (H)  02/22/2016 7.94 (H)  02/20/2016 8.70 (H)  02/19/2016 7.27 (H)  02/18/2016 10.39 (H)  02/17/2016 9.34 (H)  02/17/2016 9.32 (H)  02/16/2016 9.34 (H)  ] ROS Balance of 12 systems is negative w/ exceptions as above  PMH  Past Medical History:  Diagnosis Date  . Chronic back pain   . Chronic pain of lower extremity, bilateral   . DDD (degenerative disc disease), lumbar   . Diabetes (HCC)   . ESRD on dialysis (H[facility])   . Gout   . Hyperlipidemia   . Neuropathy (HCC)   . OA (osteoarthritis) of knee   . OSA on CPAP   . Peritonitis (H[facility])   . Polycystic kidney   . Uric acid kidney stone    PSH  Past Surgical History:  Procedure Laterality Date  . PERIPHERAL VASCULAR CATHETERIZATION Right 02/15/2016   Procedure: Fistulagram;  Surgeon: Maeola HarmanBrandon Christopher Cain, MD;  [facility];  Service: Cardiovascular;  Laterality: Right;  upper arm  . PERIPHERAL VASCULAR CATHETERIZATION Right 02/15/2016   Procedure: Peripheral Vascular Balloon Angioplasty;  Surgeon: Maeola HarmanBrandon Christopher Cain, MD;  [facility];  Service: Cardiovascular;  Laterality: Right;  upper arm venous   FH  Family History  Problem Relation Age of Onset  . Polycystic kidney disease Father   . Polycystic kidney disease  Brother   . Diabetes Mother   . Polycystic kidney disease Sister    SH  has no tobacco, alcohol, and drug history on file. Allergies  Allergies  Allergen Reactions  . Iodinated Diagnostic Agents Other (See Comments)    Pt sts that his skin turns purple and his eyes bleed.  . Iodine Anaphylaxis    IVP Dye  . Sulfa Antibiotics Anaphylaxis, Hives and Other (See Comments)  . Morphine Rash   Home medications Prior to Admission medications   Medication Sig Start Date End Date Taking? Authorizing Provider  allopurinol (ZYLOPRIM) 100 MG tablet Take 100 mg by mouth daily.   Yes Historical Provider, MD  atorvastatin (LIPITOR) 40 MG tablet Take 40 mg by mouth daily at 6 PM.   Yes Historical Provider, MD  b complex-vitamin c-folic acid (NEPHRO-VITE) 0.8 MG TABS tablet Take 1 tablet by mouth at bedtime.   Yes Historical Provider, MD  calcium acetate (PHOSLO) 667 MG capsule Take 1,334 mg by mouth 3 (three) times daily with meals.   Yes Historical Provider, MD  Darbepoetin Alfa (ARANESP) 100 MCG/0.5ML SOSY injection Inject 100 mcg into the skin every Wednesday with hemodialysis.   Yes Historical Provider, MD  ferrous sulfate 325 (65 FE) MG tablet Take 325 mg by mouth 2 (two) times daily with a meal.   Yes Historical Provider, MD  gabapentin (NEURONTIN) 100 MG capsule  Take 100 mg by mouth 3 (three) times daily.   Yes Historical Provider, MD  insulin glargine (LANTUS) 100 UNIT/ML injection Inject 16 Units into the skin 2 (two) times daily.   Yes Historical Provider, MD  insulin lispro (HUMALOG) 100 UNIT/ML injection Inject 0-16 Units into the skin 3 (three) times daily with meals. 70-150 = 0 units 151-200 = 4 units 210-250 = 8 units 251-300 = 10 units 310-350 = 12 units 351-400 = 16 units   Yes Historical Provider, MD  modafinil (PROVIGIL) 100 MG tablet Take 100 mg by mouth daily.   Yes Historical Provider, MD  oxyCODONE (OXY IR/ROXICODONE) 5 MG immediate release tablet Take 5 mg by mouth every 8  (eight) hours.   Yes Historical Provider, MD  pantoprazole (PROTONIX) 40 MG tablet Take 40 mg by mouth daily.   Yes Historical Provider, MD  polyethylene glycol (MIRALAX / GLYCOLAX) packet Take 17 g by mouth 2 (two) times daily.   Yes Historical Provider, MD  senna-docusate (SENOKOT-S) 8.6-50 MG tablet Take 1 tablet by mouth 2 (two) times daily as needed for mild constipation.   Yes Historical Provider, MD  sevelamer carbonate (RENVELA) 800 MG tablet Take 800 mg by mouth 3 (three) times daily with meals.   Yes Historical Provider, MD  tamsulosin (FLOMAX) 0.4 MG CAPS capsule Take 0.4 mg by mouth daily.   Yes Historical Provider, MD  torsemide (DEMADEX) 20 MG tablet Take 20 mg by mouth 2 (two) times daily.   Yes Historical Provider, MD  carvedilol (COREG) 12.5 MG tablet Take 18.75 mg by mouth 2 (two) times daily with a meal.     Historical Provider, MD  Cholecalciferol (VITAMIN D-1000 MAX ST) 1000 units tablet Take by mouth.    Historical Provider, MD  cinacalcet (SENSIPAR) 30 MG tablet Take 60 mg by mouth.    Historical Provider, MD  doxazosin (CARDURA) 4 MG tablet Take 4 mg by mouth.    Historical Provider, MD  Omega-3 Fatty Acids (FISH OIL) 1000 MG CAPS Take 1,000 mg by mouth daily.    Historical Provider, MD    Current Medications Scheduled Meds: . allopurinol  100 mg Oral Daily  . atorvastatin  40 mg Oral q1800  . calcium acetate  1,334 mg Oral TID WC  . carvedilol  18.75 mg Oral BID WC  . cholecalciferol  1,000 Units Oral Daily  . cinacalcet  60 mg Oral Q supper  . [START ON 03/07/2016] darbepoetin (ARANESP) injection - DIALYSIS  100 mcg Intravenous Q Wed-HD  . ferrous sulfate  325 mg Oral BID WC  . gabapentin  100 mg Oral TID  . gentamicin ointment   Topical TID  . heparin  5,000 Units Subcutaneous Q8H  . insulin aspart  0-5 Units Subcutaneous QHS  . insulin aspart  0-9 Units Subcutaneous TID WC  . insulin glargine  16 Units Subcutaneous BID  . modafinil  100 mg Oral Daily  .  oxyCODONE  5 mg Oral TID AC  . polyethylene glycol  17 g Oral BID  . sevelamer carbonate  800 mg Oral TID WC  . tamsulosin  0.4 mg Oral QPC supper  . torsemide  20 mg Oral Daily   Continuous Infusions: PRN Meds:.acetaminophen, aluminum hydroxide, bisacodyl, diphenhydrAMINE, guaiFENesin-dextromethorphan, prochlorperazine **OR** prochlorperazine **OR** prochlorperazine, traZODone  CBC  Recent Labs Lab 02/24/16 0407 02/27/16 0814 02/29/16 0819  WBC 10.0 7.9 6.7  HGB 7.8* 8.3* 8.0*  HCT 25.2* 27.6* 26.0*  MCV 88.7 89.6 88.7  PLT 257  229 211   Basic Metabolic Panel  Recent Labs Lab 02/24/16 0407 02/27/16 0814 02/29/16 0819  NA 137 138 138  K 4.5 5.2* 4.0  CL 97* 99* 100*  CO2 27 27 27   GLUCOSE 121* 126* 90  BUN 62* 62* 29*  CREATININE 8.16* 9.11* 5.24*  CALCIUM 9.9 10.4* 9.4  PHOS 6.1* 6.7* 3.9    Physical Exam  Blood pressure (!) 145/71, pulse 76, temperature 98.6 F (37 C), temperature source Oral, resp. rate 17, height 5\' 9"  (1.753 m), weight 115.2 kg (253 lb 15.5 oz), SpO2 98 %. GEN:  NAD, Obese ENT: NCAT EYES: EOMI CV: RRR PULM: CTAB ABD: obese,s/nd; RLQ PDCath bandaged, exit site C/D/I looks good SKIN: no rashes/lesions EXT:No LEE   Assessment 31M with TBI now at CIR, course c/b MSSA PNA after aspiration and cardiac arrest, CDI s/p Tx.    1. ESRD 1. PD as outpt up until admitted 2. Transitioned to HD TIW during admit using RUE AVF 3. Outpt nephrology is Dr. Kayren Eaves -- [place] 2. TBI in CIR 3. 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 Anemia of CKD and Chronic Illness -- on ESA; last TSAT 22% and ferritin 599 5. HTN, stable 6. DM2  Plan 1. Cont MWF HD Sessions via RUE AVF in PM: 2K, 2,5 Ca, No heparin, Qb 350 16g x2, 4h, probe for EDW 2. Arrange weekly PD catheter flushing with 6E nursing 3. Will see what best plan is closer to DC re PD vs HD; but given TBI might be best to transition back to PD as an outpatient 4. Cont bindersa  nd cinacalcet   Sabra Heck MD [phone] pgr 03/01/2016, 12:59 PM

## 2016-03-01 NOTE — Evaluation (Signed)
Occupational Therapy Assessment and Plan  Patient Details  Name: [name] [name] MRN: [id number] Date of Birth: November 26, 1960  OT Diagnosis: abnormal posture, acute pain and muscle weakness (generalized) Rehab Potential: Rehab Potential (ACUTE ONLY): Good ELOS: 10-14   Today's Date: 03/01/2016 OT Individual Time: 0700-0800 and 1445-1530 OT Individual Time Calculation (min): 60 min and 45 min      Problem List: Patient Active Problem List   Diagnosis Date Noted  . SDH (subdural hematoma) ([place]) 02/29/2016  . Traumatic subdural hemorrhage ([place]) 02/29/2016  . History of cardiac arrest   . Type 2 diabetes mellitus with peripheral neuropathy ([place])   . ESRD on dialysis ([place])   . OSA (obstructive sleep apnea)   . History of gout   . Anemia of chronic disease   . Essential hypertension   . Benign essential HTN   . Morbid obesity ([place])     Past Medical History:  Past Medical History:  Diagnosis Date  . Chronic back pain   . Chronic pain of lower extremity, bilateral   . DDD (degenerative disc disease), lumbar   . Diabetes ([place])   . ESRD on dialysis ([place])   . Gout   . Hyperlipidemia   . Neuropathy ([place])   . OA (osteoarthritis) of knee   . OSA on CPAP   . Peritonitis ([place])   . Polycystic kidney   . Uric acid kidney stone    Past Surgical History:  Past Surgical History:  Procedure Laterality Date  . PERIPHERAL VASCULAR CATHETERIZATION Right 02/15/2016   Procedure: Fistulagram;  Surgeon: Waynetta Sandy, MD;  Location: [place] [facility];  Service: Cardiovascular;  Laterality: Right;  upper arm  . PERIPHERAL VASCULAR CATHETERIZATION Right 02/15/2016   Procedure: Peripheral Vascular Balloon Angioplasty;  Surgeon: Waynetta Sandy, MD;  Location: [place] [facility];  Service: Cardiovascular;  Laterality: Right;  upper arm venous    Assessment & Plan Clinical Impression: Patient is a 55 y.o. year old male with history of T2DM with neuropathy and nephropathy,  polycystic kidney, ESRD-HD dependent, gout, HTN, chronic low back pain who was originally admitted to [facility] 01/20/16 with uncontrollable shaking after reports of fall with head trauma on 9/19. History taken from chart review. CT head done showing acute SDH along flax without compression. Hospital course complicated by cardiac arrest 10/1 due to aspiration event and required CPR with ROSC and intubated thorough 10/12.  Respiratory status stable post extubation but with complaint of rib, sternal and right shoulder pain. He completed treatment for MSSA aspiration PNA but was noted to be encephalopathic.  `MRI brain without acute abnormality. EEG negative for seizures. He was transferred to [facility] on 10/19 for medical management and therapy. Antibiotics narrowed to rocephin. He developed C diff colitis on 10/20 and has completed treatment.  R-AVF thrombolysis performed on 10/25 with return of flow and HD ongoing.  Has been transfuse with one unit PRBC for anemia of chronic disease. Patient transferred to CIR on 02/29/2016 .    Patient currently requires mod with basic self-care skills secondary to muscle weakness, decreased cardiorespiratoy endurance and decreased sitting balance, decreased standing balance and decreased balance strategies.  Prior to hospitalization, patient could complete ADLs with independent .  Patient will benefit from skilled intervention to increase independence with basic self-care skills prior to discharge home with care partner.  Anticipate patient will require intermittent supervision and follow up home health.  OT - End of Session Activity Tolerance: Decreased this session Endurance Deficit:  Yes Endurance Deficit Description: mutliple rest breaks secondary to fatigue OT Assessment Rehab Potential (ACUTE ONLY): Good OT Patient demonstrates impairments in the following area(s): Balance;Endurance;Motor;Safety OT Basic ADL's Functional Problem(s):  Grooming;Bathing;Dressing;Toileting OT Transfers Functional Problem(s): Toilet;Tub/Shower OT Additional Impairment(s): None OT Plan OT Intensity: Minimum of 1-2 x/day, 45 to 90 minutes OT Frequency: 5 out of 7 days OT Duration/Estimated Length of Stay: 10-14 OT Treatment/Interventions: Balance/vestibular training;Neuromuscular re-education;Self Care/advanced ADL retraining;Therapeutic Exercise;UE/LE Strength taining/ROM;Pain management;DME/adaptive equipment instruction;Community reintegration;Patient/family education;UE/LE Coordination activities;Discharge planning;Functional mobility training;Psychosocial support;Therapeutic Activities OT Self Feeding Anticipated Outcome(s): n/a OT Basic Self-Care Anticipated Outcome(s): mod I - supervision OT Toileting Anticipated Outcome(s): mod I  OT Bathroom Transfers Anticipated Outcome(s): mod I - toileting, supervision - shower OT Recommendation Patient destination: Home Follow Up Recommendations: Home health OT Equipment Recommended: To be determined   Skilled Therapeutic Intervention  Session 1: Upon entering the room, pt supine in bed and motivated for OT intervention. OT educated pt on OT purpose, POC, and goals with pt verbalizing understanding and agreement. Pt performed supine >sit with min A to EOB. Pt needing lifting assistance to stand from bed and ambulated into bathroom for bathing at shower level. Pt bathing from TTB with lateral leans for safety in order to wash buttocks. Pt fatigued and required multiple rest breaks. Pt transferring to toilet for BM and performing clothing management and hygiene with min A. Pt dressing from toilet for time management and energy conservation. Pt returning to sit in recliner chair at end of session.  Breakfast placed in front of him with call bell and all needed items within reach upon exiting the room.   Session 2: Upon entering the room, pt supine in bed but agreeable to OT intervention. Sit <>stand with  steady assistance and pt performed stand pivot transfer from bed >wheelchair. Pt propelled wheelchair 150' to ADL apartment with B UEs and B LEs. OT educating and demonstrating use of RW for transfer onto TTB in tub shower combination. Pt returning demonstration with steady assistance and min verbal cues for simulated environment. OT recommended purchase of safety treads in order to decrease fall risk while in shower. Pt returned to room in same manner and back to bed per pt request. Call bell and all needed items within reach upon exiting the room.    OT Evaluation Precautions/Restrictions  Precautions Precautions: Fall Restrictions Weight Bearing Restrictions: No  Pain Pain Assessment Pain Assessment: No/denies pain Pain Score: 8  Pain Type: Chronic pain Pain Location: Back Pain Onset: On-going Patients Stated Pain Goal: 4 Pain Intervention(s): RN made aware Multiple Pain Sites: Yes 2nd Pain Site Pain Score: 8 Pain Type: Chronic pain Pain Location: Rib cage Pain Onset: On-going Patient's Stated Pain Goal: 4 Pain Intervention(s): RN made aware Home Living/Prior Functioning Home Living Available Help at Discharge: Family, Available PRN/intermittently Type of Home: House Home Access: Stairs to enter [employer] of Steps: 2 steps to porch then small step up from porch. Entrance Stairs-Rails: None Home Layout: One level Bathroom Shower/Tub: Tub/shower unit, Industrial/product designer: Yes  Lives With: Spouse IADL History Education: highschool Occupation: On disability Prior Function Level of Independence: Independent with basic ADLs, Independent with homemaking with ambulation, Independent with gait, Independent with transfers Vocation: On disability Vision/Perception  Vision- History Baseline Vision/History: Wears glasses Wears Glasses: Reading only Patient Visual Report: No change from baseline Vision- Assessment Vision  Assessment?: No apparent visual deficits  Cognition Overall Cognitive Status: Within Functional Limits for tasks assessed Arousal/Alertness: Awake/alert Orientation  Level: Person;Place;Situation Person: Oriented Place: Oriented Situation: Oriented Year: 2017 Month: November Day of Week: Correct Memory: Appears intact Immediate Memory Recall: Sock;Blue;Bed Memory Recall: Sock;Blue;Bed Memory Recall Sock: Without Cue Memory Recall Blue: Without Cue Memory Recall Bed: Without Cue Awareness: Appears intact Problem Solving: Appears intact Safety/Judgment: Appears intact Sensation Sensation Light Touch: Appears Intact Stereognosis: Not tested Hot/Cold: Not tested Proprioception: Appears Intact Motor  Motor Motor - Skilled Clinical Observations: generalized weakness Mobility  Transfers Transfers: Sit to Stand;Stand to Sit Sit to Stand: 3: Mod assist Stand to Sit: 3: Mod assist  Balance Balance Balance Assessed: Yes Dynamic Sitting Balance Dynamic Sitting - Balance Support: Feet supported;During functional activity Dynamic Sitting - Level of Assistance: 5: Stand by assistance Dynamic Standing Balance Dynamic Standing - Balance Support: Bilateral upper extremity supported Dynamic Standing - Level of Assistance: 4: Min assist Extremity/Trunk Assessment RUE Assessment RUE Assessment: Within Functional Limits LUE Assessment LUE Assessment: Within Functional Limits   See Function Navigator for Current Functional Status.   Refer to Care Plan for Long Term Goals  Recommendations for other services: None  Discharge Criteria: Patient will be discharged from OT if patient refuses treatment 3 consecutive times without medical reason, if treatment goals not met, if there is a change in medical status, if patient makes no progress towards goals or if patient is discharged from hospital.  The above assessment, treatment plan, treatment alternatives and goals were discussed and  mutually agreed upon: by patient  Gypsy Decant 03/01/2016, 9:51 AM

## 2016-03-01 NOTE — Progress Notes (Signed)
Patient information reviewed and entered into eRehab system by Ericka Marcellus, RN, CRRN, PPS Coordinator.  Information including medical coding and functional independence measure will be reviewed and updated through discharge.     Per nursing patient was given "Data Collection Information Summary for Patients in Inpatient Rehabilitation Facilities with attached "Privacy Act Statement-Health Care Records" upon admission.  

## 2016-03-01 NOTE — Care Management Note (Signed)
[facility]patient Rehabilitation Center Individual Statement of Services  Patient Name:  [name] [name]  Date:  03/01/2016  Welcome to the Inpatient Rehabilitation Center.  Our goal is to provide you with an individualized program based on your diagnosis and situation, designed to meet your specific needs.  With this comprehensive rehabilitation program, you will be expected to participate in at least 3 hours of rehabilitation therapies Monday-Friday, with modified therapy programming on the weekends.  Your rehabilitation program will include the following services:  Physical Therapy (PT), Occupational Therapy (OT), Speech Therapy (ST), 24 hour per day rehabilitation nursing, Therapeutic Recreaction (TR), Case Management (Social Worker), Rehabilitation Medicine, Nutrition Services and Pharmacy Services  Weekly team conferences will be held on Tuesday to discuss your progress.  Your Social Worker will talk with you frequently to get your input and to update you on team discussions.  Team conferences with you and your family in attendance may also be held.  Expected length of stay: 10-14 days  Overall anticipated outcome: mod/i-supervision with tub transfers  Depending on your progress and recovery, your program may change. Your Social Worker will coordinate services and will keep you informed of any changes. Your Social Worker's name and contact numbers are listed  below.  The following services may also be recommended but are not provided by the Inpatient Rehabilitation Center:   Driving Evaluations  Home Health Rehabiltiation Services  Outpatient Rehabilitation Services   Arrangements will be made to provide these services after discharge if needed.  Arrangements include referral to agencies that provide these services.  Your insurance has been verified to be:  Medicare & BCBS Your primary doctor is:    Pertinent information will be shared with your doctor and your insurance  company.  Social Worker:  Amada JupiterLucy Hoyle, [place] [phone] or [phone]) [phone]   Information discussed with and copy given to patient by: Lucy Chrisupree, Haileyann Staiger G, 03/01/2016, 11:27 AM

## 2016-03-01 NOTE — Progress Notes (Signed)
PHYSICAL MEDICINE & REHABILITATION     PROGRESS NOTE    Subjective/Complaints: Had a restess night. "oxycodone was decreased too quickly" had night sweats and increased pain.   ROS: Pt denies fever, rash/itching, headache, blurred or double vision, nausea, vomiting, abdominal pain, diarrhea, chest pain, shortness of breath, palpitations, dysuria, dizziness,  bleeding,   or depression   Objective: Vital Signs: Blood pressure (!) 145/71, pulse 76, temperature 98.6 F (37 C), temperature source Oral, resp. rate 17, height 5\' 9"  (1.753 m), weight 115.2 kg (253 lb 15.5 oz), SpO2 98 %. No results found.  Recent Labs  02/29/16 0819  WBC 6.7  HGB 8.0*  HCT 26.0*  PLT 211    Recent Labs  02/29/16 0819  NA 138  K 4.0  CL 100*  GLUCOSE 90  BUN 29*  CREATININE 5.24*  CALCIUM 9.4   CBG (last 3)   Recent Labs  02/29/16 2202 03/01/16 0642 03/01/16 1141  GLUCAP 147* 88 127*    Wt Readings from Last 3 Encounters:  02/29/16 115.2 kg (253 lb 15.5 oz)  02/28/16 115.2 kg (254 lb)    Physical Exam:  Constitutional: He is oriented to person, place, and time.  Obese    HENT:  Head: Normocephalic and atraumatic.  Mouth/Throat: Oropharynx is pink  Eyes: Conjunctivae and EOM are normal. Pupils are equal, round, and reactive to light.  Neck: Normal range of motion. Neck supple.  Cardiovascular: Normal rate and regular rhythm. No JVD  Murmur heard. Respiratory: Effort normal and breath sounds normal. No stridor. No respiratory distress. He has no wheezes.  +[place] in place GI: Soft. Bowel sounds are normal. He exhibits no distension. There is tenderness ?RLQ --near Peritoneal dialysis catheter in place with dry dressing.   Musculoskeletal: He exhibits no edema or tenderness. Minimal edema Neurological: He is alert and oriented to person, place, and time. No cranial nerve deficit.  Motor: 4/5 grossly throughout all 4 limbs. Good sitting balance Skin: Skin is warm and  dry. AVG RUE Psychiatric:sl anxious  Assessment/Plan: 1. Functional deficits secondary to TBI which require 3+ hours per day of interdisciplinary therapy in a comprehensive inpatient rehab setting. Physiatrist is providing close team supervision and 24 hour management of active medical problems listed below. Physiatrist and rehab team continue to assess barriers to discharge/monitor patient progress toward functional and medical goals.  Function:  Bathing Bathing position      Bathing parts      Bathing assist        Upper Body Dressing/Undressing Upper body dressing                    Upper body assist        Lower Body Dressing/Undressing Lower body dressing                                  Lower body assist        Toileting Toileting   Toileting steps completed by patient: Adjust clothing prior to toileting, Performs perineal hygiene, Adjust clothing after toileting   Toileting Assistive Devices: Grab bar or rail  Toileting assist Assist level: Touching or steadying assistance (Pt.75%)   Transfers Chair/bed Optician, dispensingtransfer             Locomotion Ambulation           Wheelchair          Cognition Comprehension Comprehension assist  level: Follows complex conversation/direction with no assist  Expression Expression assist level: Expresses complex ideas: With no assist  Social Interaction Social Interaction assist level: Interacts appropriately with others - No medications needed.  Problem Solving Problem solving assist level: Solves complex problems: Recognizes & self-corrects  Memory Memory assist level: Complete Independence: No helper   Medical Problem List and Plan: 1.  Weakness, mobility deficits, difficulty completing ADLs secondary to TBI and multimedical.  -begin inpatient therapies 2.  DVT Prophylaxis/Anticoagulation: lovenox 3. Pain Management: On Gabapentin tid to help manage neuropathy BLE.  -due  To increaesd pain and sense  of withdrawal will increase oxycodone to 5-10mg  q8 prn.  4. Mood: Motivated to get better and get home. LCSW to follow for evaluation and support.  5. Neuropsych: This patient is capable of making decisions on his own behalf. 6. Skin/Wound Care: Routine pressure relief measures.  7. Fluids/Electrolytes/Nutrition:  Strict I/O. Check daily weights. 1200 cc FR/day.   -labs pending with HD for tomorrow 8. T2DM with nerupathy: Monitor BS ac/hs. Continue lantus bid with SSI for elevated BS. Titrate insulin as indicated  -sugars well controlled at present.  9. OSA: Resume CPAP and wean off oxygen. 10. ESRD:  HD MWF later in day to help with therapy completion.  11. Gout: Managed on allopurinol. 12. Anemia of chronic disease: Monitor serially and transfuse if needed. On aranesp weekly as well as iron for supplement.   -hgb hovers around 8.0 13. HTN: Monitor BID 14. Morbid Obesity:               LOS (Days) 1 A FACE TO FACE EVALUATION WAS PERFORMED  SWARTZ,ZACHARY T 03/01/2016 1:52 PM

## 2016-03-01 NOTE — Evaluation (Signed)
Physical Therapy Assessment and Plan  Patient Details  Name: [name] [name] MRN: [id number] Date of Birth: 1960/11/20  PT Diagnosis: Abnormal posture, Abnormality of gait, Difficulty walking, Muscle weakness, Osteoarthritis and Pain in back Rehab Potential: Good ELOS: 12-14 days   Today's Date: 03/01/2016 PT Individual Time: 1000-1100 PT Individual Time Calculation (min): 60 min     Problem List:  Patient Active Problem List   Diagnosis Date Noted  . SDH (subdural hematoma) ([place]) 02/29/2016  . Traumatic subdural hemorrhage ([place]) 02/29/2016  . History of cardiac arrest   . Type 2 diabetes mellitus with peripheral neuropathy ([place])   . ESRD on dialysis ([place])   . OSA (obstructive sleep apnea)   . History of gout   . Anemia of chronic disease   . Essential hypertension   . Benign essential HTN   . Morbid obesity ([place])     Past Medical History:  Past Medical History:  Diagnosis Date  . Chronic back pain   . Chronic pain of lower extremity, bilateral   . DDD (degenerative disc disease), lumbar   . Diabetes ([place])   . ESRD on dialysis ([place])   . Gout   . Hyperlipidemia   . Neuropathy ([place])   . OA (osteoarthritis) of knee   . OSA on CPAP   . Peritonitis ([place])   . Polycystic kidney   . Uric acid kidney stone    Past Surgical History:  Past Surgical History:  Procedure Laterality Date  . PERIPHERAL VASCULAR CATHETERIZATION Right 02/15/2016   Procedure: Fistulagram;  Surgeon: Waynetta Sandy, MD;  Location: [place] [facility];  Service: Cardiovascular;  Laterality: Right;  upper arm  . PERIPHERAL VASCULAR CATHETERIZATION Right 02/15/2016   Procedure: Peripheral Vascular Balloon Angioplasty;  Surgeon: Waynetta Sandy, MD;  Location: [place] [facility];  Service: Cardiovascular;  Laterality: Right;  upper arm venous    Assessment & Plan Clinical Impression: Patient is a 55 yo male presented [facility] to [facility] and then transferred to [facility] with  uncontrollable shaking, hematuria and mechanical fall with head trauma and loss of consciousness on 01/10/16. Head CT showed acute subdural hemorrhage. While at [facility], patient had a cardiac arrest, required CPR, was intubated and was treated for aspiration pneumonia. Patient was extubaed on 02/02/16. The patient suffered rib fracutes and sternal fracture post CPR. Patient was admitted to [facility] on 02/09/16. He is on enteric precautions for C-Diff and contact precautions for MSSA. Patient is currently on a dysphagia 3 diet with thin liquids. He has 02 at 2.5 L [place]. He is receiving HD M-W-F. PT/OT evaluations were completed with ongoing treatments. Inpatient rehab was recommended by therapies.  Patient transferred to CIR on 02/29/2016 .   Patient currently requires mod with mobility secondary to muscle weakness, decreased cardiorespiratoy endurance and decreased sitting balance, decreased standing balance, decreased postural control and decreased balance strategies.  Prior to hospitalization, patient was modified independent  with mobility and lived with Spouse in a House home.  Home access is 2 steps to porch then small step up from porch.Stairs to enter.  Patient will benefit from skilled PT intervention to maximize safe functional mobility, minimize fall risk and decrease caregiver burden for planned discharge home with intermittent assist.  Anticipate patient will benefit from follow up [facility] at discharge.  PT - End of Session Activity Tolerance: Tolerates 30+ min activity with multiple rests Endurance Deficit: Yes Endurance Deficit Description: mutliple rest breaks due to fatigue PT Assessment Rehab Potential (  ACUTE/IP ONLY): Good Barriers to Discharge: Decreased caregiver support;Inaccessible home environment PT Patient demonstrates impairments in the following area(s): Balance;Safety;Endurance;Motor PT Transfers Functional Problem(s): Bed Mobility;Bed to  Chair;Car;Furniture PT Locomotion Functional Problem(s): Stairs;Wheelchair Mobility;Ambulation PT Plan PT Intensity: Minimum of 1-2 x/day ,45 to 90 minutes PT Frequency: 5 out of 7 days PT Duration Estimated Length of Stay: 12-14 days PT Treatment/Interventions: Ambulation/gait training;Balance/vestibular training;Community reintegration;Discharge planning;Disease management/prevention;DME/adaptive equipment instruction;Functional mobility training;Neuromuscular re-education;Pain management;Patient/family education;Stair training;Psychosocial support;Therapeutic Activities;Therapeutic Exercise;UE/LE Coordination activities;UE/LE Strength taining/ROM;Wheelchair propulsion/positioning PT Transfers Anticipated Outcome(s): modI with LRAD PT Locomotion Anticipated Outcome(s): modI with LRAD in home, S stairs and community gait short distances PT Recommendation Follow Up Recommendations: Home health PT Patient destination: Home Equipment Recommended: To be determined Equipment Details: awaiting approval for power chair  Skilled Therapeutic Intervention Pt received seated in recliner, c/o pain as below and agreeable to treatment. PT initial evaluation performed and completed with modA overall as described below. Pt required gradual reduction in assist throughout session to minA. Pt primarily limited by pain, poor endurance, LE strength deficits. Pt is very motivated to participate and improve independence to return home at a modI level if possible, however does report family is available to help him during the day while his wife is at work if he needs it. Educated pt in rehab process, goals, estimated length of stay, falls prevention with recommendation pt use call bell to wait for staff before getting up; pt agreeable to all the above. Remained seated in recliner at end of session, all needs in reach.   PT Evaluation Precautions/Restrictions Precautions Precautions: Fall Restrictions Weight Bearing  Restrictions: No General Chart Reviewed: Yes Response to Previous Treatment: Patient reporting fatigue but able to participate. Family/Caregiver Present: No  Pain Pain Assessment Pain Assessment: 0-10 Pain Score: 10-Worst pain ever Pain Type: Chronic pain Pain Location: Back Pain Radiating Towards:  (RIBS) Pain Descriptors / Indicators: Aching Pain Onset: On-going Patients Stated Pain Goal: 7 Pain Intervention(s): Medication (See eMAR) Multiple Pain Sites: Yes 2nd Pain Site Pain Score: 8 Pain Type: Chronic pain Pain Location: Rib cage Pain Onset: On-going Patient's Stated Pain Goal: 4 Pain Intervention(s): RN made aware Home Living/Prior Functioning Home Living Living Arrangements: Spouse/significant other Available Help at Discharge: Family;Available PRN/intermittently Type of Home: House Home Access: Stairs to enter [employer] of Steps: 2 steps to porch then small step up from porch. Entrance Stairs-Rails: None Home Layout: One level Bathroom Shower/Tub: Product/process development scientist: Standard Bathroom Accessibility: Yes  Lives With: Spouse Prior Function Level of Independence: Independent with basic ADLs;Independent with homemaking with ambulation;Independent with gait;Independent with transfers Vocation: On disability Vision/Perception    WFL Cognition Overall Cognitive Status: Within Functional Limits for tasks assessed Arousal/Alertness: Awake/alert Orientation Level: Oriented X4 Memory: Appears intact Awareness: Appears intact Problem Solving: Appears intact Safety/Judgment: Appears intact Sensation Sensation Light Touch: Appears Intact Stereognosis: Not tested Hot/Cold: Not tested Proprioception: Appears Intact Motor  Motor Motor - Skilled Clinical Observations: generalized weakness  Mobility Bed Mobility Bed Mobility: Not assessed Transfers Transfers: Yes Sit to Stand: 3: Mod assist Sit to Stand Details: Verbal cues for  precautions/safety;Verbal cues for technique;Tactile cues for placement Sit to Stand Details (indicate cue type and reason): cues for hand placement Stand to Sit: 3: Mod assist Stand to Sit Details (indicate cue type and reason): Manual facilitation for placement Stand to Sit Details: cues for eccentric control Stand Pivot Transfers: 3: Mod assist;With armrests Stand Pivot Transfer Details: Verbal cues for precautions/safety;Verbal cues for technique;Verbal cues for gait  pattern;Tactile cues for posture Stand Pivot Transfer Details (indicate cue type and reason): with RW, modA sit <>stand, minA for pivotal steps Locomotion  Ambulation Ambulation: Yes Ambulation/Gait Assistance: 4: Min assist Ambulation Distance (Feet): 40 Feet Assistive device: Rolling walker Ambulation/Gait Assistance Details: Verbal cues for technique;Verbal cues for precautions/safety;Tactile cues for posture Gait Gait: Yes Gait Pattern: Impaired Gait Pattern: Decreased stride length;Poor foot clearance - right;Poor foot clearance - left Gait velocity: decreased for age/gender norms Stairs / Additional Locomotion Stairs: No (pt declined d/t fatigue, LE strength deficits) Product manager Mobility: Yes Wheelchair Assistance: 5: Investment banker, operational Details: Verbal cues for Marketing executive: Both upper extremities Wheelchair Parts Management: Needs assistance Distance: 150'  Trunk/Postural Assessment  Cervical Assessment Cervical Assessment: Exceptions to [facility] (forward head posture) Thoracic Assessment Thoracic Assessment: Exceptions to [facility] (rounded shoulders) Lumbar Assessment Lumbar Assessment: Exceptions to [facility] (reduced lumbar lordosis) Postural Control Postural Control: Within Functional Limits  Balance Balance Balance Assessed: Yes Dynamic Sitting Balance Dynamic Sitting - Balance Support: Feet supported;During functional activity Dynamic Sitting - Level of  Assistance: 5: Stand by assistance Dynamic Sitting - Balance Activities: Forward lean/weight shifting;Reaching for objects;Reaching across midline Static Standing Balance Static Standing - Balance Support: Bilateral upper extremity supported Static Standing - Level of Assistance: 5: Stand by assistance Dynamic Standing Balance Dynamic Standing - Balance Support: Bilateral upper extremity supported;During functional activity Dynamic Standing - Level of Assistance: 4: Min assist Dynamic Standing - Balance Activities: Forward lean/weight shifting;Lateral lean/weight shifting Extremity Assessment  RUE Assessment RUE Assessment: Within Functional Limits LUE Assessment LUE Assessment: Within Functional Limits RLE Assessment RLE Assessment: Exceptions to [facility] RLE Strength RLE Overall Strength: Deficits Right Hip Flexion: 4-/5 Right Knee Flexion: 4-/5 Right Knee Extension: 4/5 Right Ankle Dorsiflexion: 4/5 Right Ankle Plantar Flexion: 4/5 LLE Assessment LLE Assessment: Exceptions to WFL LLE Strength LLE Overall Strength: Deficits Left Hip Flexion: 4-/5 Left Knee Flexion: 4-/5 Left Knee Extension: 4/5 Left Ankle Dorsiflexion: 4/5 Left Ankle Plantar Flexion: 4/5   See Function Navigator for Current Functional Status.   Refer to Care Plan for Long Term Goals  Recommendations for other services: None  Discharge Criteria: Patient will be discharged from PT if patient refuses treatment 3 consecutive times without medical reason, if treatment goals not met, if there is a change in medical status, if patient makes no progress towards goals or if patient is discharged from hospital.  The above assessment, treatment plan, treatment alternatives and goals were discussed and mutually agreed upon: by patient  Luberta Mutter 03/01/2016, 12:58 PM

## 2016-03-02 ENCOUNTER — Inpatient Hospital Stay (HOSPITAL_COMMUNITY): Payer: Medicare Other

## 2016-03-02 ENCOUNTER — Inpatient Hospital Stay (HOSPITAL_COMMUNITY): Payer: Medicare Other | Admitting: Physical Therapy

## 2016-03-02 ENCOUNTER — Inpatient Hospital Stay (HOSPITAL_COMMUNITY): Payer: Medicare Other | Admitting: Occupational Therapy

## 2016-03-02 DIAGNOSIS — S065X1S Traumatic subdural hemorrhage with loss of consciousness of 30 minutes or less, sequela: Secondary | ICD-10-CM

## 2016-03-02 LAB — RENAL FUNCTION PANEL
ALBUMIN: 2.5 g/dL — AB (ref 3.5–5.0)
ANION GAP: 9 (ref 5–15)
BUN: 40 mg/dL — ABNORMAL HIGH (ref 6–20)
CALCIUM: 10.3 mg/dL (ref 8.9–10.3)
CO2: 28 mmol/L (ref 22–32)
CREATININE: 7.35 mg/dL — AB (ref 0.61–1.24)
Chloride: 102 mmol/L (ref 101–111)
GFR calc non Af Amer: 7 mL/min — ABNORMAL LOW (ref >60)
GFR, EST AFRICAN AMERICAN: 9 mL/min — AB (ref >60)
GLUCOSE: 107 mg/dL — AB (ref 65–99)
PHOSPHORUS: 4 mg/dL (ref 2.5–4.6)
Potassium: 4.7 mmol/L (ref 3.5–5.1)
SODIUM: 139 mmol/L (ref 135–145)

## 2016-03-02 LAB — CBC
HCT: 25.7 % — ABNORMAL LOW (ref 39.0–52.0)
HEMOGLOBIN: 7.9 g/dL — AB (ref 13.0–17.0)
MCH: 27.3 pg (ref 26.0–34.0)
MCHC: 30.7 g/dL (ref 30.0–36.0)
MCV: 88.9 fL (ref 78.0–100.0)
PLATELETS: 194 10*3/uL (ref 150–400)
RBC: 2.89 MIL/uL — AB (ref 4.22–5.81)
RDW: 15.6 % — ABNORMAL HIGH (ref 11.5–15.5)
WBC: 7.2 10*3/uL (ref 4.0–10.5)

## 2016-03-02 LAB — GLUCOSE, CAPILLARY
GLUCOSE-CAPILLARY: 125 mg/dL — AB (ref 65–99)
GLUCOSE-CAPILLARY: 79 mg/dL (ref 65–99)
Glucose-Capillary: 91 mg/dL (ref 65–99)

## 2016-03-02 MED ORDER — SODIUM CHLORIDE 0.9 % IV SOLN: 100.0000 mL | INTRAVENOUS | Status: DC | PRN

## 2016-03-02 MED ORDER — HEPARIN SODIUM (PORCINE) 1000 UNIT/ML DIALYSIS: 1000.0000 [IU] | INTRAMUSCULAR | Status: DC | PRN

## 2016-03-02 MED ORDER — PENTAFLUOROPROP-TETRAFLUOROETH EX AERO: 1.0000 "application " | INHALATION_SPRAY | CUTANEOUS | Status: DC | PRN

## 2016-03-02 MED ORDER — LIDOCAINE 5 % EX PTCH
2.0000 | MEDICATED_PATCH | CUTANEOUS | Status: DC
  Administered 2016-03-02 – 2016-03-06 (×5): 2 via TRANSDERMAL
  Filled 2016-03-02 (×6): qty 2

## 2016-03-02 MED ORDER — FAMOTIDINE 20 MG PO TABS
20.0000 mg | ORAL_TABLET | Freq: Every day | ORAL | Status: DC
  Administered 2016-03-02 – 2016-03-09 (×8): 20 mg via ORAL
  Filled 2016-03-02 (×8): qty 1

## 2016-03-02 MED ORDER — ALTEPLASE 2 MG IJ SOLR: 2.0000 mg | Freq: Once | INTRAMUSCULAR | Status: DC | PRN

## 2016-03-02 MED ORDER — LIDOCAINE-PRILOCAINE 2.5-2.5 % EX CREA: 1.0000 "application " | TOPICAL_CREAM | CUTANEOUS | Status: DC | PRN

## 2016-03-02 MED ORDER — LIDOCAINE HCL (PF) 1 % IJ SOLN: 5.0000 mL | INTRAMUSCULAR | Status: DC | PRN

## 2016-03-02 NOTE — Progress Notes (Signed)
[facility] Rounding Note  Subjective/Interval History:  Seen in HD Says can't tolerate 3 liter UF d/t cramping but can do 2.5 L No particular complaints Using AVF with 16 ga needles - he says 15's used at [facility] no problem PD cath not flushed yet  Objective Vital signs in last 24 hours: Vitals:   02/29/16 2300 03/01/16 0538 03/01/16 1427 03/02/16 0633  BP:  (!) 145/71 118/64 138/68  Pulse: 98 76 77 75  Resp: 18 17 18 18   Temp:  98.6 F (37 C) 99.9 F (37.7 C) 98.9 F (37.2 C)  TempSrc:  Oral Oral Oral  SpO2: 98% 98% 92% 92%  Weight:      Height:       Weight change:   Intake/Output Summary (Last 24 hours) at 03/02/16 1357 Last data filed at 03/02/16 1300  Gross per 24 hour  Intake              720 ml  Output                0 ml  Net              720 ml   Physical Exam:  Blood pressure 138/68, pulse 75, temperature 98.9 F (37.2 C), temperature source Oral, resp. rate 18, height 5\' 9"  (1.753 m), weight 115.2 kg (253 lb 15.5 oz), SpO2 92 %.  Very nice pale app WM Could not stand to weigh - "just too tired" R upper AVF 400 Lungs clear S1S2 No S3 Abd obese, PD cath R lower abd dressing in place 2+ edema LE's  Labs:  Recent Labs Lab 02/27/16 0814 02/29/16 0819  NA 138 138  K 5.2* 4.0  CL 99* 100*  CO2 27 27  GLUCOSE 126* 90  BUN 62* 29*  CREATININE 9.11* 5.24*  CALCIUM 10.4* 9.4  PHOS 6.7* 3.9     Recent Labs Lab 02/27/16 0814 02/29/16 0819  ALBUMIN 2.6* 2.3*     Recent Labs Lab 02/27/16 0814 02/29/16 0819  WBC 7.9 6.7  HGB 8.3* 8.0*  HCT 27.6* 26.0*  MCV 89.6 88.7  PLT 229 211     Recent Labs Lab 03/01/16 1141 03/01/16 1639 03/01/16 2121 03/02/16 0648 03/02/16 1129  GLUCAP 127* 107* 127* 91 125*     Medications:  . allopurinol  100 mg Oral Daily  . atorvastatin  40 mg Oral q1800  . calcium acetate  1,334 mg Oral TID WC  . carvedilol  18.75 mg Oral BID WC  . cholecalciferol  1,000 Units Oral Daily  . cinacalcet  60 mg Oral Q  supper  . [START ON 03/07/2016] darbepoetin (ARANESP) injection - DIALYSIS  100 mcg Intravenous Q Wed-HD  . famotidine  20 mg Oral Daily  . ferrous sulfate  325 mg Oral BID WC  . gabapentin  100 mg Oral TID  . gentamicin ointment   Topical TID  . heparin  5,000 Units Subcutaneous Q8H  . insulin aspart  0-5 Units Subcutaneous QHS  . insulin aspart  0-9 Units Subcutaneous TID WC  . insulin glargine  16 Units Subcutaneous BID  . lidocaine  2 patch Transdermal Q24H  . modafinil  100 mg Oral Daily  . polyethylene glycol  17 g Oral BID  . sevelamer carbonate  800 mg Oral TID WC  . tamsulosin  0.4 mg Oral QPC supper  . 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 [facility]19 found to have SDH and during admission had cardiac arrest 2/2 aspiration, MSSA PNA, CDI. Was prev at [facility] now in CIR.    Assessment/Recommendations  1. ESRD 1. PD as outpt up until admitted 2. Transitioned to HD TIW during admit using RUE AVF - continue MWF schedule 3. Outpt nephrology is Dr. Kayren EavesErnest Johnson -- [place] 4. Cont MWF HD Sessions via RUE AVF in PM: 2K, 2,5 Ca, No heparin, Qb 350 16g x2, 4h, probe for EDW 5. Arrange weekly PD catheter flushing with 6E nursing 6. Will see what best plan is closer to DC re PD vs HD; but given TBI might be best to transition back to PD as an outpatient and that's what he wants to do 7. I don't see utility of torsemide in this pt - will stop 2. TBI in CIR 3. 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 Anemia of CKD and Chronic Illness -- on Darbe 100 QWed; last TSAT 22% and ferritin 599 5. HTN, stable 6. DM2   Camille Balynthia Ruger Saxer, MD [facility] [phone] pager 03/02/2016, 1:57 PM

## 2016-03-02 NOTE — Progress Notes (Signed)
Occupational Therapy Session Note  Patient Details  Name: [name] BasemanLonnie L Bernat MRN: [id number] Date of Birth: 22-Oct-1960  Today's Date: 03/02/2016 OT Individual Time: 0700-0759 OT Individual Time Calculation (min): 59 min     Short Term Goals: Week 1:  OT Short Term Goal 1 (Week 1): Pt will perform shower transfer with steady assistance in order to increase I with task. OT Short Term Goal 2 (Week 1): Pt will perform LB dressing with min A in order to increase I with self care. OT Short Term Goal 3 (Week 1): Pt will demonstrate proper technique of HEP for B UE strengthening with use of paper handout . OT Short Term Goal 4 (Week 1): Pt will perform toileting with steady assistance in order to increase I with functional task.   Skilled Therapeutic Interventions/Progress Updates:    Pt resting in recliner upon arrival and agreeable to therapy this morning.  Pt engaged in BADL retraining including bathing at shower level and dressing with sit<>stand from EOB.  Pt amb with RW to bathroom (close supervision) and transferred to tub bench.  Pt completed bathing tasks employing lateral leans to bathe buttocks.  Pt required min A for sit<>stand from tub bench and amb with RW to EOB for dressing tasks.  Pt completed grooming tasks seated in w/c at sink.  Pt requires more than a reasonable amount of time to complete tasks with multiple rest breaks.  Continued discharge planning and discussed home safety.  Focus on activity tolerance, sit<>stand, standing balance, functional transfers, functional amb with RW, and safety awareness to increase independence with BADLs.  Therapy Documentation Precautions:  Precautions Precautions: Fall Restrictions Weight Bearing Restrictions: No Pain:  Pt denied pain  See Function Navigator for Current Functional Status.   Therapy/Group: Individual Therapy  Rich BraveLanier, Avanelle Pixley Chappell 03/02/2016, 7:59 AM

## 2016-03-02 NOTE — Progress Notes (Signed)
Occupational Therapy Session Note  Patient Details  Name: [name] [name] MRN: [id number] Date of Birth: [birth year]  Today's Date: 03/02/2016 OT Individual Time: 1300-1415 OT Individual Time Calculation (min): 75 min     Short Term Goals: Week 1:  OT Short Term Goal 1 (Week 1): Pt will perform shower transfer with steady assistance in order to increase I with task. OT Short Term Goal 2 (Week 1): Pt will perform LB dressing with min A in order to increase I with self care. OT Short Term Goal 3 (Week 1): Pt will demonstrate proper technique of HEP for B UE strengthening with use of paper handout . OT Short Term Goal 4 (Week 1): Pt will perform toileting with steady assistance in order to increase I with functional task.  Week 2:     Skilled Therapeutic Interventions/Progress Updates:      Pt lying in bed upon OT arrival.  He agreed to engage in OT.  SBA with bed mobility.  Min assist with sit to stand.  Ambulated to bathroom with cues for slow steady steps.  Pt had no LOB.  He managed pants in standing; transferred from stand to sit and vice versa with min assist.   Ambulated back to sink with RW and min assist. PPt propelled wc to rehab with Supervision about 225 feet.    Performed Sci fit at 6 wkload for 5min x 4 forward and backward with a minute rest.  Utilized 3# weight for BUE for 1 set x10 for sho press, sho abduction.  Propelled wc back to room.  Ambulated 5 feet to bed.  Transferred to bed wiith SBA and left with all needs in reach.   therapy Documentation Precautions:  Precautions Precautions: Fall Restrictions Weight Bearing Restrictions: No General:   Vital Signs: Therapy Vitals Temp: 99.1 F (37.3 C) Temp Source: Oral Pulse Rate: 75 Resp: 18 BP: 128/64 Patient Position (if appropriate): Lying Oxygen Therapy SpO2: 93 % O2 Device: Not Delivered Pain:  Chest 5/5 with RN giving lidocaine patch   ADL:   Exercises:   Other Treatments:    See Function Navigator  for Current Functional Status.   Therapy/Group: Individual Therapy  [name], [name] J 03/02/2016, 2:42 PM

## 2016-03-02 NOTE — Progress Notes (Signed)
[facility] PHYSICAL MEDICINE & REHABILITATION     PROGRESS NOTE    Subjective/Complaints: Had a much better night. Increase in oxycodone helped.    ROS: Pt denies fever, rash/itching, headache, blurred or double vision, nausea, vomiting, abdominal pain, diarrhea, chest pain, shortness of breath, palpitations, dysuria, dizziness, neck  pain, bleeding, anxiety, or depression    Objective: Vital Signs: Blood pressure 138/68, pulse 75, temperature 98.9 F (37.2 C), temperature source Oral, resp. rate 18, height 5\' 9"  (1.753 m), weight 115.2 kg (253 lb 15.5 oz), SpO2 92 %. No results found.  Recent Labs  02/29/16 0819  WBC 6.7  HGB 8.0*  HCT 26.0*  PLT 211    Recent Labs  02/29/16 0819  NA 138  K 4.0  CL 100*  GLUCOSE 90  BUN 29*  CREATININE 5.24*  CALCIUM 9.4   CBG (last 3)   Recent Labs  03/01/16 1639 03/01/16 2121 03/02/16 0648  GLUCAP 107* 127* 91    Wt Readings from Last 3 Encounters:  02/29/16 115.2 kg (253 lb 15.5 oz)  02/28/16 115.2 kg (254 lb)    Physical Exam:  Constitutional: He is oriented to person, place, and time.  Obese    HENT:  Head: Normocephalic and atraumatic.  Mouth/Throat: Oropharynx moist Eyes: Conjunctivae and EOM are normal. Pupils are equal, round, and reactive to light.  Neck: Normal range of motion. Neck supple.  Cardiovascular: Normal rate and regular rhythm. No JVD  Murmur present Respiratory: Effort normal and breath sounds normal. No stridor. No respiratory distress. He has no wheezes.    GI: Soft. Bowel sounds are normal. He exhibits no distension. There is tenderness ?RLQ --near Peritoneal dialysis catheter in place with dry dressing intact.   Musculoskeletal: He exhibits no edema or tenderness. tr edema Neurological: He is alert and oriented to person, place, and time. No cranial nerve deficit. appropriate Motor: 4/5 grossly throughout all 4 limbs. Good sitting balance Skin: Skin is warm and dry. AVG RUE  intact Psychiatric:sl anxious  Assessment/Plan: 1. Functional deficits secondary to TBI which require 3+ hours per day of interdisciplinary therapy in a comprehensive inpatient rehab setting. Physiatrist is providing close team supervision and 24 hour management of active medical problems listed below. Physiatrist and rehab team continue to assess barriers to discharge/monitor patient progress toward functional and medical goals.  Function:  Bathing Bathing position   Position: Shower  Bathing parts Body parts bathed by patient: Right arm, Left arm, Chest, Abdomen, Front perineal area, Buttocks, Right upper leg, Left upper leg, Right lower leg, Left lower leg, Back Body parts bathed by helper: Right lower leg, Left lower leg, Back  Bathing assist Assist Level: Supervision or verbal cues      Upper Body Dressing/Undressing Upper body dressing   What is the patient wearing?: Pull over shirt/dress     Pull over shirt/dress - Perfomed by patient: Thread/unthread right sleeve, Thread/unthread left sleeve, Put head through opening, Pull shirt over trunk          Upper body assist Assist Level: Supervision or verbal cues      Lower Body Dressing/Undressing Lower body dressing   What is the patient wearing?: Pants, Non-skid slipper socks, Shoes     Pants- Performed by patient: Thread/unthread right pants leg, Thread/unthread left pants leg, Pull pants up/down Pants- Performed by helper: Thread/unthread right pants leg, Thread/unthread left pants leg Non-skid slipper socks- Performed by patient: Don/doff right sock, Don/doff left sock Non-skid slipper socks- Performed by helper: Don/doff  right sock, Don/doff left sock     Shoes - Performed by patient: Don/doff right shoe, Don/doff left shoe            Lower body assist Assist for lower body dressing: Touching or steadying assistance (Pt > 75%)      Toileting Toileting   Toileting steps completed by patient: Adjust clothing  prior to toileting, Adjust clothing after toileting, Performs perineal hygiene   Toileting Assistive Devices: Grab bar or rail  Toileting assist Assist level: Touching or steadying assistance (Pt.75%)   Transfers Chair/bed transfer   Chair/bed transfer method: Ambulatory Chair/bed transfer assist level: Moderate assist (Pt 50 - 74%/lift or lower) Chair/bed transfer assistive device: Patent attorneyWalker     Locomotion Ambulation     Max distance: 5840' Assist level: Touching or steadying assistance (Pt > 75%)   Wheelchair     Max wheelchair distance: 150' Assist Level: Supervision or verbal cues  Cognition Comprehension Comprehension assist level: Follows basic conversation/direction with extra time/assistive device  Expression Expression assist level: Expresses complex ideas: With extra time/assistive device  Social Interaction Social Interaction assist level: Interacts appropriately with others - No medications needed.  Problem Solving Problem solving assist level: Solves basic problems with no assist  Memory Memory assist level: More than reasonable amount of time   Medical Problem List and Plan: 1.  Weakness, mobility deficits, difficulty completing ADLs secondary to TBI and multimedical.  -continue inpatient therapies  -progressing quickly 2.  DVT Prophylaxis/Anticoagulation: lovenox 3. Pain Management: On Gabapentin tid to help manage neuropathy BLE.  -doing well with oxycodone 5-10mg  q8 prn  4. Mood: Motivated to get better and get home. LCSW to follow for evaluation and support.  5. Neuropsych: This patient is capable of making decisions on his own behalf. 6. Skin/Wound Care: Routine pressure relief measures.  7. Fluids/Electrolytes/Nutrition:  Strict I/O. Check daily weights. 1200 cc FR/day.   -labs  with HD today 8. T2DM with nerupathy: Monitor BS ac/hs. Continue lantus bid with SSI for elevated BS. Titrate insulin as indicated  -sugars reviewed and remain well controlled at  present.  9. OSA: CPAP, weaning oxygen. 10. ESRD:  HD MWF later in day to help with therapy completion.  11. Gout: Managed on allopurinol.  12. Anemia of chronic disease: Monitor serially and transfuse if needed. On aranesp weekly as well as iron for supplement.   -hgb around 8.0 13. HTN: Monitor BID 14. Morbid Obesity:               LOS (Days) 2 A FACE TO FACE EVALUATION WAS PERFORMED  [name] [name] T 03/02/2016 8:50 AM

## 2016-03-02 NOTE — Progress Notes (Signed)
Pt is on NIV tolerating it well.  

## 2016-03-02 NOTE — Procedures (Signed)
I have personally attended this patient's dialysis session.   No heparin Says cannot tolerate 3L without cramping - agrees to 2.5 R upper arm AVF cannulated - using 16 ga but RN says has used 15's on him at [facility] EDW not established Pt declined to stand to weigh  Camille Balynthia Harshika Mago, MD [facility] [phone] Pager 03/02/2016, 4:45 PM

## 2016-03-02 NOTE — Progress Notes (Signed)
Physical Therapy Session Note  Patient Details  Name: [name] [name] MRN: [id number] Date of Birth: Sep 09, 1960  Today's Date: 03/02/2016 PT Individual Time: 0905-1005 PT Individual Time Calculation (min): 60 min    Short Term Goals: Week 1:  PT Short Term Goal 1 (Week 1): Pt will perform bed mobility S PT Short Term Goal 2 (Week 1): Pt will perform sit <>stand with consistent minA PT Short Term Goal 3 (Week 1): Pt will perform stand pivot transfers with minA PT Short Term Goal 4 (Week 1): Pt will perform gait with RW and min guard x75' PT Short Term Goal 5 (Week 1): Pt will initiate stair training  Skilled Therapeutic Interventions/Progress Updates:   Pt presented in chair agreeable to therapy. Requesting to use toilet. Sit to stand from chair with CGA with min verbal cues for hand placement. Ambulated 62f to toilet sit to/from stand from elevated toilet seat CGA.  W/c propulsion 2012ffor endurance with supervision. Up/down ramp with RW CGA and gait on compliant surface CGA requiring seated rest 2/2 fatigue. Ascend/decend x 8 2in steps 2 rails with CGA.  Per pt request ascend/decend x4 6in steps 2 rails with minA noted increased use of UE with pt pulling self up by 4th step. Noted increased pt fatigue. Changed leg rests 2/2 R leg rest locking with caster when turning.  Nu Step for LE strength and endurance. L2 x2m51m L4x6 min with break at 5mi16mvg 60 steps/min.  Sit to stand transfer from NuStep CGA with improved technique as progressed through treatment. Pt propelled to room and transferred to recliner in same manner.  Left in room with all current needs met.   Therapy Documentation Precautions:  Precautions Precautions: Fall Restrictions Weight Bearing Restrictions: No General:   Vital Signs:   Pain:   Mobility:   Locomotion :    Trunk/Postural Assessment :    Balance:   Exercises:   Other Treatments:     See Function Navigator for Current Functional  Status.   Therapy/Group: Individual Therapy  Elesa Garman  ,Tagan Bartram, PTA  03/02/2016, 12:19 PM

## 2016-03-03 ENCOUNTER — Inpatient Hospital Stay (HOSPITAL_COMMUNITY): Payer: Medicare Other | Admitting: Occupational Therapy

## 2016-03-03 ENCOUNTER — Inpatient Hospital Stay (HOSPITAL_COMMUNITY): Payer: Medicare Other | Admitting: Physical Therapy

## 2016-03-03 ENCOUNTER — Inpatient Hospital Stay (HOSPITAL_COMMUNITY): Payer: Medicare Other | Admitting: Speech Pathology

## 2016-03-03 LAB — GLUCOSE, CAPILLARY
GLUCOSE-CAPILLARY: 90 mg/dL (ref 65–99)
GLUCOSE-CAPILLARY: 117 mg/dL — AB (ref 65–99)
GLUCOSE-CAPILLARY: 113 mg/dL — AB (ref 65–99)
Glucose-Capillary: 112 mg/dL — ABNORMAL HIGH (ref 65–99)

## 2016-03-03 MED ORDER — SEVELAMER CARBONATE 800 MG PO TABS
1600.0000 mg | ORAL_TABLET | Freq: Three times a day (TID) | ORAL | Status: DC
  Administered 2016-03-03 – 2016-03-09 (×17): 1600 mg via ORAL
  Filled 2016-03-03 (×16): qty 2

## 2016-03-03 MED ORDER — PREGABALIN 75 MG PO CAPS
75.0000 mg | ORAL_CAPSULE | Freq: Every day | ORAL | Status: DC
  Administered 2016-03-03 – 2016-03-09 (×7): 75 mg via ORAL
  Filled 2016-03-03 (×7): qty 1

## 2016-03-03 MED ORDER — SODIUM CHLORIDE 0.9 % IV SOLN
62.5000 mg | INTRAVENOUS | Status: DC
  Administered 2016-03-07: 62.5 mg via INTRAVENOUS
  Filled 2016-03-03 (×2): qty 5

## 2016-03-03 NOTE — Progress Notes (Signed)
Physical Therapy Session Note  Patient Details  Name: [name] [name] MRN: [id number] Date of Birth: [birth year]  Today's Date: 03/03/2016 PT Individual Time: 1101-1201 PT Individual Time Calculation (min): 60 min    Short Term Goals: Week 1:  PT Short Term Goal 1 (Week 1): Pt will perform bed mobility S PT Short Term Goal 2 (Week 1): Pt will perform sit <>stand with consistent minA PT Short Term Goal 3 (Week 1): Pt will perform stand pivot transfers with minA PT Short Term Goal 4 (Week 1): Pt will perform gait with RW and min guard x75' PT Short Term Goal 5 (Week 1): Pt will initiate stair training  Skilled Therapeutic Interventions/Progress Updates:   Pt presented in bed agreeable for therapy. Supine to sit at EOB with HOB elevated and use of bed rails with supervision. PTA assisting in donning clothse/shoes for time management. Performed sit to stand from bed with RW and CGA with cues for proper hand placement. Pt ambulated 157f x 1 with RW with min cues for looking forward and erect posture. Pt ambulated with step through pattern with slow cadence. Performed with no rest breaks. Pt performed step ups with 3" step for LE stregthening.  Use of 1 rail for balance. Pt performed forward step ups bilaterally 2x5, lateral step ups bilaterally x5 each side.  Pt requiring seated therapeutic break after second set/5.  Pt demonstrated increased consistent sit to/from stand transfers throughout session with improved safety. Continued LE strengthening NuStep L3 LE only x557m, x3 min with rest break. Pt ambulated additional 11465ft end of session unable to ambulate further 2/2 to fatigue. Pt returned to room via w/c and transferred to bed via stand pivot with RW.  Pt left at EOB with NT present and all current needs met.   Therapy Documentation Precautions:  Precautions Precautions: Fall Restrictions Weight Bearing Restrictions: No General:   Vital Signs: Therapy Vitals Pulse Rate: 68 BP: (!)  114/58 Pain:   Mobility:   Locomotion :    Trunk/Postural Assessment :    Balance:   Exercises:   Other Treatments:     See Function Navigator for Current Functional Status.   Therapy/Group: Individual Therapy  Isadora Delorey  Shenouda Genova, PTA  03/03/2016, 12:22 PM

## 2016-03-03 NOTE — Progress Notes (Signed)
Placed patient on CPAP for the night.  

## 2016-03-03 NOTE — Progress Notes (Signed)
[facility] Rounding Note  Subjective/Interval History:   Had HD yesterday Said doesn't tolerate 3 liter UF d/t cramping but can do 2.5 L - and so got net UF of 2500 Used AVF with 16 ga needles - RN who did his TMT had dialyzed him at [facility] and said 15's used no problem Finally got PD cath flushed but dressing was not changed...  Says sweated all night and L leg hurts   Objective Vital signs in last 24 hours: Vitals:   03/02/16 1900 03/02/16 1930 03/02/16 2037 03/03/16 0629  BP: 123/69 115/66 123/75 123/60  Pulse: 73 74 77 72  Resp: 16 16 17 18   Temp:  98.6 F (37 C) 99.8 F (37.7 C) 99.3 F (37.4 C)  TempSrc:  Oral Oral Oral  SpO2:  94% 97% 100%  Weight:  103.5 kg (228 lb 2.8 oz)  104.1 kg (229 lb 8 oz)  Height:       Weight change:   Intake/Output Summary (Last 24 hours) at 03/03/16 1037 Last data filed at 03/03/16 0900  Gross per 24 hour  Intake              360 ml  Output             2500 ml  Net            -2140 ml   Physical Exam:  Blood pressure 123/60, pulse 72, temperature 99.3 F (37.4 C), temperature source Oral, resp. rate 18, height 5\' 9"  (1.753 m), weight 104.1 kg (229 lb 8 oz), SpO2 100 %.  Very nice pale app WM Could not stand to weigh - "just too tired" - post HD bed weight was 103.5 kg R upper AVF good bruit Lungs clear S1S2 No S3 Abd obese, PD cath R lower abd with loose non-occlusive dressing !+ edema LLE, trace RLE  Labs:  Recent Labs Lab 02/27/16 0814 02/29/16 0819 03/02/16 1540  NA 138 138 139  K 5.2* 4.0 4.7  CL 99* 100* 102  CO2 27 27 28   GLUCOSE 126* 90 107*  BUN 62* 29* 40*  CREATININE 9.11* 5.24* 7.35*  CALCIUM 10.4* 9.4 10.3  PHOS 6.7* 3.9 4.0     Recent Labs Lab 02/27/16 0814 02/29/16 0819 03/02/16 1540  ALBUMIN 2.6* 2.3* 2.5*     Recent Labs Lab 02/27/16 0814 02/29/16 0819 03/02/16 1541  WBC 7.9 6.7 7.2  HGB 8.3* 8.0* 7.9*  HCT 27.6* 26.0* 25.7*  MCV 89.6 88.7 88.9  PLT 229 211 194     Recent Labs Lab  03/01/16 2121 03/02/16 0648 03/02/16 1129 03/02/16 2051 03/03/16 0658  GLUCAP 127* 91 125* 79 90     Medications:  . allopurinol  100 mg Oral Daily  . atorvastatin  40 mg Oral q1800  . calcium acetate  1,334 mg Oral TID WC  . carvedilol  18.75 mg Oral BID WC  . cholecalciferol  1,000 Units Oral Daily  . cinacalcet  60 mg Oral Q supper  . [START ON 03/07/2016] darbepoetin (ARANESP) injection - DIALYSIS  100 mcg Intravenous Q Wed-HD  . famotidine  20 mg Oral Daily  . gabapentin  100 mg Oral TID  . gentamicin ointment   Topical TID  . heparin  5,000 Units Subcutaneous Q8H  . insulin aspart  0-5 Units Subcutaneous QHS  . insulin aspart  0-9 Units Subcutaneous TID WC  . insulin glargine  16 Units Subcutaneous BID  . lidocaine  2 patch Transdermal Q24H  .  modafinil  100 mg Oral Daily  . polyethylene glycol  17 g Oral BID  . sevelamer carbonate  800 mg Oral TID WC  . 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 [facility] found to have SDH and during admission had cardiac arrest 2/2 aspiration, MSSA PNA, CDiff.  Prev on PD (still has catheter), but currently getting HD via his backup AVF (last intervention 02/15/16 thrombolysis) Was prev at [facility] now in CIR for rehab.    Assessment/Recommendations  1. ESRD 1. PD as outpt up until admitted 2. Transitioned to HD TIW during admit using RUE AVF  3. Outpt nephrology is Dr. Kayren EavesErnest Johnson -- [place] 4. Cont MWF HD Sessions via RUE AVF in PM: 2K, 2 Ca, No heparin, Qb 400  Go to 15 ga needles Prev used at [facility] without issues). still not at EDW but at 103.5 better - dry probably closer to 102.5. Next HD on Monday - see if he can stand for weight. Remove his "max" of 2.5 liters. Orders written for Monday 5. Arranged weekly PD catheter flushing with 6E nursing - done 11/11  - still needs dressing changes (re-ordered) 6. Will see what best plan is closer to DC re PD vs HD; he wishes to return to PD when home for CIR 7. 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 Stop phoslo, increase sevelemer, continue sensipar. Change to 2Ca bath with HD 4. Anemia of CKD and Chronic Illness -- on Darbe 100 QWed; last TSAT 22% and ferritin 599 (02/10/16). Add weekly iron dose. 5. HTN, stable 6. DM2   Camille Balynthia Kalimah Capurro, MD [facility] [phone] pager 03/03/2016, 10:37 AM

## 2016-03-03 NOTE — Progress Notes (Signed)
03/03/16 08:40 Order to flush patient's peritoneal dialysis catheter. Catheter had some dried stool on clamp and some on tubing. Catheter was cleaned prior to accessing it. Using aseptic technique,catheter flushed with peritoneal dialysis solution 1.5% low magnesium,low calcium 1000 cc,flow was sluggish. Drained only 400cc of clear light yellow peritoneal fluid. Advised patient to watch so that catheter won't get any stool as it's a source of infection. Patient's RN also made aware of dried stool noted on catheter and to make sure catheter won't get any more stool. [name] [name], [name] [name] [name] [name], RCharity fundraiser

## 2016-03-03 NOTE — Progress Notes (Signed)
Physical Therapy Session Note  Patient Details  Name: [name] [name] MRN: [id number] Date of Birth: October 30, 1960  Today's Date: 03/03/2016 PT Individual Time:  7829-56211532-1615 PT Individual Time Calculation (min): 43 min    Short Term Goals: Week 1:  PT Short Term Goal 1 (Week 1): Pt will perform bed mobility S PT Short Term Goal 2 (Week 1): Pt will perform sit <>stand with consistent minA PT Short Term Goal 3 (Week 1): Pt will perform stand pivot transfers with minA PT Short Term Goal 4 (Week 1): Pt will perform gait with RW and min guard x75' PT Short Term Goal 5 (Week 1): Pt will initiate stair training  Skilled Therapeutic Interventions/Progress Updates:     Patient received sitting in WC with trade off from OT in rehab gym.   PT transferred patient to first floor of hospital. Gait training instructed by PT on various tiled surfaces x16910ft and 3440ft with min Assist from PT and on standing rest break. PT also instructed patient in gait on carpeted surface for simulated community environment x 75 ft with min Assist. Pt provided min cues for AD management and increased foot clearance.   PT transported patient to rehab gym. Stand pivot transfer with RW to bed  And sit<>supine transfer with supervision Assist. PT instructed patient in Supine therex with hand out provided.  Heel slides x 10 BLE,  Hip abduction x 10 BLE.  SAQ x 12 BLE,  Bridges x 8 with 2 second hold.  PT provided min cues for proper speed of movements to increase strengthening and decreased compensation through trunk.   Patient returned WC following stand pivot transfer with supervision Assist with RW and traded off with OT.   Therapy Documentation Precautions:  Precautions Precautions: Fall Restrictions Weight Bearing Restrictions: No General:   Vital Signs: Therapy Vitals Pulse Rate: 68 BP: (!) 114/58    See Function Navigator for Current Functional Status.   Therapy/Group: Individual Therapy  Golden Popustin E  Shoshanna Mcquitty 03/03/2016, 12:58 PM

## 2016-03-03 NOTE — Progress Notes (Signed)
Occupational Therapy Session Note  Patient Details  Name: [name] [name] MRN: [id number] Date of Birth: [birth year]  Today's Date: 03/03/2016 OT Individual Time:  -  1430-1530   (60 min)  1st session                                         1615-1645   (30 min)  2nd session       Short Term Goals: Week 1:  OT Short Term Goal 1 (Week 1): Pt will perform shower transfer with steady assistance in order to increase I with task. OT Short Term Goal 2 (Week 1): Pt will perform LB dressing with min A in order to increase I with self care. OT Short Term Goal 3 (Week 1): Pt will demonstrate proper technique of HEP for B UE strengthening with use of paper handout . OT Short Term Goal 4 (Week 1): Pt will perform toileting with steady assistance in order to increase I with functional task.  Week 2:    Week 3:     Skilled Therapeutic Interventions/Progress Updates:    1st session:    Supine in bed upon OT arrival.  Side lying to sit with supervision.   Donned slipper shoes (set up).  Ambulated with RW with wc to follow. Ambulated to nursing station (74 feet).  Rested in sitting.   Ambulated with RW and contact guard to day room (113 feet).  Performed kinetron for 30 sec in stand na d 6 min in sitting with minimal resistance.  Performed arm bike for 10 minutes with forward and backward moves.  Passed to PT for next session.    2nd session:  Engage in standing balance using therapeutic game of horseshoes.  Pt used LUE to pitch and retrieve with knee flexion about 30 degrees when retrieving.   Ambulated 100 feet back to room and transferred to bed with SBA.  Left with all needs in reach.   Therapy Documentation Precautions:  Precautions Precautions: Fall Restrictions Weight Bearing Restrictions: No General:   Vital Signs: Therapy Vitals Temp: 98.6 F (37 C) Temp Source: Oral Pulse Rate: 77 Resp: 18 BP: (!) 106/52 Patient Position (if appropriate): Lying Oxygen Therapy SpO2: 94 % O2  Device: Not Delivered Pain:   none         Other Treatments:    See Function Navigator for Current Functional Status.   Therapy/Group: Individual Therapy  [name], [name] 03/03/2016, 2:49 PM

## 2016-03-03 NOTE — Progress Notes (Signed)
[place] PHYSICAL MEDICINE & REHABILITATION     PROGRESS NOTE    Subjective/Complaints: Still, with burning pain left anterior shin, mainly at night also has tingling and burning pain in feet bilaterally  ROS: Pt denies fever, rash/itching, headache, blurred or double vision, nausea, vomiting, abdominal pain, diarrhea, chest pain, shortness of breath, palpitations, dysuria, dizziness, neck  pain, bleeding, anxiety, or depression    Objective: Vital Signs: Blood pressure (!) 114/58, pulse 68, temperature 99.3 F (37.4 C), temperature source Oral, resp. rate 18, height 5\' 9"  (1.753 m), weight 104.1 kg (229 lb 8 oz), SpO2 100 %. No results found.  Recent Labs  03/02/16 1541  WBC 7.2  HGB 7.9*  HCT 25.7*  PLT 194    Recent Labs  03/02/16 1540  NA 139  K 4.7  CL 102  GLUCOSE 107*  BUN 40*  CREATININE 7.35*  CALCIUM 10.3   CBG (last 3)   Recent Labs  03/02/16 1129 03/02/16 2051 03/03/16 0658  GLUCAP 125* 79 90    Wt Readings from Last 3 Encounters:  03/03/16 104.1 kg (229 lb 8 oz)  02/28/16 115.2 kg (254 lb)    Physical Exam:  Constitutional: He is oriented to person, place, and time.  Obese    HENT:  Head: Normocephalic and atraumatic.  Mouth/Throat: Oropharynx moist Eyes: Conjunctivae and EOM are normal. Pupils are equal, round, and reactive to light.  Neck: Normal range of motion. Neck supple.  Cardiovascular: Normal rate and regular rhythm. No JVD  Murmur present Respiratory: Effort normal and breath sounds normal. No stridor. No respiratory distress. He has no wheezes.    GI: Soft. Bowel sounds are normal. He exhibits no distension. There is tenderness ?RLQ --near Peritoneal dialysis catheter in place with dry dressing intact.   Musculoskeletal: He exhibits no edema or tenderness. tr edema, Left lower extremity without tenderness, no erythema, no swelling. Bilateral feet. Without edema or skin breakdown.  Neurological: He is alert and oriented to  person, place, and time. No cranial nerve deficit. appropriate Motor: 4/5 grossly throughout all 4 limbs. Good sitting balance Skin: Skin is warm and dry. AVG RUE intact Psychiatric:sl anxious  Assessment/Plan: 1. Functional deficits secondary to TBI which require 3+ hours per day of interdisciplinary therapy in a comprehensive inpatient rehab setting. Physiatrist is providing close team supervision and 24 hour management of active medical problems listed below. Physiatrist and rehab team continue to assess barriers to discharge/monitor patient progress toward functional and medical goals.  Function:  Bathing Bathing position   Position: Shower  Bathing parts Body parts bathed by patient: Right arm, Left arm, Chest, Abdomen, Front perineal area, Buttocks, Right upper leg, Left upper leg, Right lower leg, Left lower leg, Back Body parts bathed by helper: Right lower leg, Left lower leg, Back  Bathing assist Assist Level: Supervision or verbal cues      Upper Body Dressing/Undressing Upper body dressing   What is the patient wearing?: Pull over shirt/dress     Pull over shirt/dress - Perfomed by patient: Thread/unthread right sleeve, Thread/unthread left sleeve, Put head through opening, Pull shirt over trunk          Upper body assist Assist Level: Supervision or verbal cues      Lower Body Dressing/Undressing Lower body dressing   What is the patient wearing?: Pants, Non-skid slipper socks, Shoes     Pants- Performed by patient: Thread/unthread right pants leg, Thread/unthread left pants leg, Pull pants up/down Pants- Performed by helper: Thread/unthread  right pants leg, Thread/unthread left pants leg Non-skid slipper socks- Performed by patient: Don/doff right sock, Don/doff left sock Non-skid slipper socks- Performed by helper: Don/doff right sock, Don/doff left sock     Shoes - Performed by patient: Don/doff right shoe, Don/doff left shoe            Lower body  assist Assist for lower body dressing: Touching or steadying assistance (Pt > 75%)      Toileting Toileting   Toileting steps completed by patient: Adjust clothing prior to toileting, Adjust clothing after toileting, Performs perineal hygiene   Toileting Assistive Devices: Grab bar or rail  Toileting assist Assist level: Touching or steadying assistance (Pt.75%)   Transfers Chair/bed transfer   Chair/bed transfer method: Ambulatory Chair/bed transfer assist level: Moderate assist (Pt 50 - 74%/lift or lower) Chair/bed transfer assistive device: Patent attorneyWalker     Locomotion Ambulation     Max distance: 25  x2 Assist level: Touching or steadying assistance (Pt > 75%)   Wheelchair     Max wheelchair distance: 200 Assist Level: Supervision or verbal cues  Cognition Comprehension Comprehension assist level: Follows basic conversation/direction with extra time/assistive device  Expression Expression assist level: Expresses complex ideas: With extra time/assistive device  Social Interaction Social Interaction assist level: Interacts appropriately with others - No medications needed.  Problem Solving Problem solving assist level: Solves basic problems with no assist  Memory Memory assist level: More than reasonable amount of time   Medical Problem List and Plan: 1.  Weakness, mobility deficits, difficulty completing ADLs secondary to TBI and multimedical.  -continue inpatient therapies  -progressing quickly 2.  DVT Prophylaxis/Anticoagulation: lovenox 3. Pain Management: On Gabapentin tid to help manage neuropathy BLE, Partially effective but will trial Lyrica in its place.  Sleep is still poor, related to neuropathic pain.  -doing well with oxycodone 5-10mg  q8 prn  4. Mood: Motivated to get better and get home. LCSW to follow for evaluation and support.  5. Neuropsych: This patient is capable of making decisions on his own behalf. 6. Skin/Wound Care: Routine pressure relief measures.  7.  Fluids/Electrolytes/Nutrition:  Strict I/O. Check daily weights. 1200 cc FR/day.   -labs  with HD today 8. T2DM with nerupathy: Monitor BS ac/hs. Continue lantus bid with SSI for elevated BS. Titrate insulin as indicated  -sugars reviewed and remain well controlled at present.  9. OSA: CPAP, weaning oxygen. 10. ESRD:  HD MWF later in day to help with therapy completion. Nephrology to manage 11. Gout: Managed on allopurinol. No current evidence of gout flare 12. Anemia of chronic disease: Monitor serially and transfuse if needed. On aranesp weekly as well as iron for supplement.   -hgb around 8.0 13. HTN: Monitor BID 14. Morbid Obesity:               LOS (Days) 3 A FACE TO FACE EVALUATION WAS PERFORMED  Claudette LawsKIRSTEINS,Leroy Pettway E 03/03/2016 11:18 AM

## 2016-03-04 ENCOUNTER — Inpatient Hospital Stay (HOSPITAL_COMMUNITY): Payer: Medicare Other | Admitting: Physical Therapy

## 2016-03-04 LAB — GLUCOSE, CAPILLARY
GLUCOSE-CAPILLARY: 77 mg/dL (ref 65–99)
GLUCOSE-CAPILLARY: 79 mg/dL (ref 65–99)
GLUCOSE-CAPILLARY: 147 mg/dL — AB (ref 65–99)
Glucose-Capillary: 82 mg/dL (ref 65–99)

## 2016-03-04 NOTE — Progress Notes (Signed)
[place] PHYSICAL MEDICINE & REHABILITATION     PROGRESS NOTE    Subjective/Complaints: Leg pain slightly better, still sleeping is still fair  ROS: Pt denies fever, rash/itching, headache, blurred or double vision, nausea, vomiting, abdominal pain, diarrhea, chest pain, shortness of breath, palpitations, dysuria, dizziness, neck  pain, bleeding, anxiety, or depression    Objective: Vital Signs: Blood pressure 123/60, pulse 73, temperature 98.7 F (37.1 C), temperature source Oral, resp. rate 18, height 5\' 9"  (1.753 m), weight 106.1 kg (234 lb), SpO2 98 %. No results found.  Recent Labs  03/02/16 1541  WBC 7.2  HGB 7.9*  HCT 25.7*  PLT 194    Recent Labs  03/02/16 1540  NA 139  K 4.7  CL 102  GLUCOSE 107*  BUN 40*  CREATININE 7.35*  CALCIUM 10.3   CBG (last 3)   Recent Labs  03/03/16 1644 03/03/16 2104 03/04/16 0709  GLUCAP 112* 113* 77    Wt Readings from Last 3 Encounters:  03/04/16 106.1 kg (234 lb)  02/28/16 115.2 kg (254 lb)    Physical Exam:  Constitutional: He is oriented to person, place, and time.  Obese    HENT:  Head: Normocephalic and atraumatic.  Mouth/Throat: Oropharynx moist Eyes: Conjunctivae and EOM are normal. Pupils are equal, round, and reactive to light.  Neck: Normal range of motion. Neck supple.  Cardiovascular: Normal rate and regular rhythm. No JVD  Murmur present Respiratory: Effort normal and breath sounds normal. No stridor. No respiratory distress. He has no wheezes.    GI: Soft. Bowel sounds are normal. He exhibits no distension. There is tenderness ?RLQ --near Peritoneal dialysis catheter in place with dry dressing intact.   Musculoskeletal: He exhibits no edema or tenderness. tr edema, Left lower extremity without tenderness, no erythema, no swelling. Bilateral feet. Without edema or skin breakdown.  Neurological: He is alert and oriented to person, place, and time. No cranial nerve deficit. appropriate Motor: 4/5  grossly throughout all 4 limbs. Good sitting balance Skin: Skin is warm and dry. AVG RUE intact Psychiatric:sl anxious  Assessment/Plan: 1. Functional deficits secondary to TBI which require 3+ hours per day of interdisciplinary therapy in a comprehensive inpatient rehab setting. Physiatrist is providing close team supervision and 24 hour management of active medical problems listed below. Physiatrist and rehab team continue to assess barriers to discharge/monitor patient progress toward functional and medical goals.  Function:  Bathing Bathing position   Position: Shower  Bathing parts Body parts bathed by patient: Right arm, Left arm, Chest, Abdomen, Front perineal area, Buttocks, Right upper leg, Left upper leg, Right lower leg, Left lower leg, Back Body parts bathed by helper: Right lower leg, Left lower leg, Back  Bathing assist Assist Level: Supervision or verbal cues      Upper Body Dressing/Undressing Upper body dressing   What is the patient wearing?: Pull over shirt/dress     Pull over shirt/dress - Perfomed by patient: Thread/unthread right sleeve, Thread/unthread left sleeve, Put head through opening, Pull shirt over trunk          Upper body assist Assist Level: Supervision or verbal cues      Lower Body Dressing/Undressing Lower body dressing   What is the patient wearing?: Pants, Non-skid slipper socks, Shoes     Pants- Performed by patient: Thread/unthread right pants leg, Thread/unthread left pants leg, Pull pants up/down Pants- Performed by helper: Thread/unthread right pants leg, Thread/unthread left pants leg Non-skid slipper socks- Performed by patient: Don/doff right  sock, Don/doff left sock Non-skid slipper socks- Performed by helper: Don/doff right sock, Don/doff left sock     Shoes - Performed by patient: Don/doff right shoe, Don/doff left shoe            Lower body assist Assist for lower body dressing: Touching or steadying assistance (Pt >  75%)      Toileting Toileting   Toileting steps completed by patient: Adjust clothing prior to toileting, Performs perineal hygiene, Adjust clothing after toileting   Toileting Assistive Devices: Grab bar or rail  Toileting assist Assist level: Touching or steadying assistance (Pt.75%)   Transfers Chair/bed transfer   Chair/bed transfer method: Ambulatory Chair/bed transfer assist level: Supervision or verbal cues Chair/bed transfer assistive device: Patent attorneyWalker     Locomotion Ambulation     Max distance: 113 Assist level: Touching or steadying assistance (Pt > 75%)   Wheelchair     Max wheelchair distance: 200 Assist Level: Supervision or verbal cues  Cognition Comprehension Comprehension assist level: Follows basic conversation/direction with no assist  Expression Expression assist level: Expresses complex ideas: With no assist  Social Interaction Social Interaction assist level: Interacts appropriately with others - No medications needed.  Problem Solving Problem solving assist level: Solves complex problems: Recognizes & self-corrects  Memory Memory assist level: Complete Independence: No helper   Medical Problem List and Plan: 1.  Weakness, mobility deficits, difficulty completing ADLs secondary to TBI and multimedical.  -continue inpatient therapies, PT, OT, speech  -progressing quickly 2.  DVT Prophylaxis/Anticoagulation: lovenox 3. Pain Management:trial Lyrica Sleep is still poor, related to neuropathic pain.  -doing well with oxycodone 5-10mg  q8 prn  4. Mood: Motivated to get better and get home. LCSW to follow for evaluation and support.  5. Neuropsych: This patient is capable of making decisions on his own behalf. 6. Skin/Wound Care: Routine pressure relief measures.  7. Fluids/Electrolytes/Nutrition:  Strict I/O. Check daily weights. 1200 cc FR/day.   -labs  with HD today 8. T2DM with nerupathy: Monitor BS ac/hs. Continue lantus bid with SSI for elevated BS.  Titrate insulin as indicated  -sugars reviewed and remain well controlled at present.  9. OSA: CPAP, weaning oxygen. 10. ESRD:  HD MWF later in day to help with therapy completion. Nephrology to manage 11. Gout: Managed on allopurinol. No current evidence of gout flare 12. Anemia of chronic disease: Monitor serially and transfuse if needed. On aranesp weekly as well as iron for supplement.   -hgb around 8.0 13. HTN: Monitor BID 14. Morbid Obesity:               LOS (Days) 4 A FACE TO FACE EVALUATION WAS PERFORMED  Claudette LawsKIRSTEINS,ANDREW E 03/04/2016 11:20 AM

## 2016-03-04 NOTE — IPOC Note (Signed)
Overall Plan of Care [facility](IPOC) Patient Details Name: [name] BasemanLonnie L [name] MRN: [id number] DOB: [birth year]  Admitting Diagnosis: TBI Post Arrest VDRF  Hospital Problems: Active Problems:   SDH (subdural hematoma) (HCC)   Traumatic subdural hemorrhage (HCC)   History of cardiac arrest   Type 2 diabetes mellitus with peripheral neuropathy (HCC)   ESRD on dialysis (HCC)   OSA (obstructive sleep apnea)   History of gout   Anemia of chronic disease   Essential hypertension   Benign essential HTN   Morbid obesity (HCC)     Functional Problem List: Nursing Skin Integrity, Pain, Nutrition, Medication Management, Bladder, Safety  PT Balance, Safety, Endurance, Motor  OT Balance, Endurance, Motor, Safety  SLP    TR         Basic ADL's: OT Grooming, Bathing, Dressing, Toileting     Advanced  ADL's: OT       Transfers: PT Bed Mobility, Bed to Chair, Car, Occupational psychologisturniture  OT Toilet, Research scientist (life sciences)Tub/Shower     Locomotion: PT Stairs, Psychologist, prison and probation servicesWheelchair Mobility, Ambulation     Additional Impairments: OT None  SLP        TR      Anticipated Outcomes Item Anticipated Outcome  Self Feeding n/a  Swallowing      Basic self-care  mod I - supervision  Toileting  mod I    Bathroom Transfers mod I - toileting, supervision - shower  Bowel/Bladder  HD without incident  Transfers  modI with LRAD  Locomotion  modI with LRAD in home, S stairs and community gait short distances  Communication     Cognition     Pain  Managed at goal 2/10  Safety/Judgment  Increased safety awareness, no falls, injury this admission.   Therapy Plan: PT Intensity: Minimum of 1-2 x/day ,45 to 90 minutes PT Frequency: 5 out of 7 days PT Duration Estimated Length of Stay: 12-14 days OT Intensity: Minimum of 1-2 x/day, 45 to 90 minutes OT Frequency: 5 out of 7 days OT Duration/Estimated Length of Stay: 10-14         Team Interventions: Nursing Interventions Patient/Family Education, Bladder Management, Disease  Management/Prevention, Pain Management, Skin Care/Wound Management, Medication Management, Psychosocial Support  PT interventions Ambulation/gait training, Warden/rangerBalance/vestibular training, Community reintegration, Discharge planning, Disease management/prevention, DME/adaptive equipment instruction, Functional mobility training, Neuromuscular re-education, Pain management, Patient/family education, Stair training, Psychosocial support, Therapeutic Activities, Therapeutic Exercise, UE/LE Coordination activities, UE/LE Strength taining/ROM, Wheelchair propulsion/positioning  OT Interventions Warden/rangerBalance/vestibular training, Neuromuscular re-education, Self Care/advanced ADL retraining, Therapeutic Exercise, UE/LE Strength taining/ROM, Pain management, DME/adaptive equipment instruction, Community reintegration, Equities traderatient/family education, UE/LE Coordination activities, Discharge planning, Functional mobility training, Psychosocial support, Therapeutic Activities  SLP Interventions    TR Interventions    SW/CM Interventions Discharge Planning, Psychosocial Support, Patient/Family Education    Team Discharge Planning: Destination: PT-Home ,OT- Home , SLP-  Projected Follow-up: PT-Home health PT, OT-  Home health OT, SLP-None Projected Equipment Needs: PT-To be determined, OT- To be determined, SLP-None recommended by SLP Equipment Details: PT-awaiting approval for power chair, OT-  Patient/family involved in discharge planning: PT- Patient,  OT-Patient, SLP-Patient  MD ELOS: 12-16d Medical Rehab Prognosis:  Good Assessment:  55 year old male with history of T2DM with neuropathy and nephropathy, polycystic kidney, ESRD-HD dependent, gout, HTN, chronic low back pain who was originally admitted to [facility] 01/20/16 with uncontrollable shaking after reports of fall with head trauma on 9/19. History taken from chart review. CT head done showing acute SDH along flax without compression. Hospital  course  complicated by cardiac arrest 10/1 due to aspiration event and required CPR with ROSC and intubated thorough 10/12.  Respiratory status stable post extubation but with complaint of rib, sternal and right shoulder pain. He completed treatment for MSSA aspiration PNA but was noted to be encephalopathic.  `MRI brain without acute abnormality. EEG negative for seizures. [facility] and therapy. Antibiotics narrowed to rocephin. He developed C diff colitis on 10/20 and has completed treatment.  R-AVF thrombolysis performed on 10/25    Now requiring 24/7 Rehab RN,MD, as well as CIR level PT, OT and SLP.  Treatment team will focus on ADLs and mobility with goals set at Mod I  See Team Conference Notes for weekly updates to the plan of care

## 2016-03-04 NOTE — Progress Notes (Signed)
Patient placed on hospital CPAP machine with no complications. Nurse aware. 

## 2016-03-04 NOTE — Progress Notes (Signed)
Physical Therapy Session Note  Patient Details  Name: [name] [name] MRN: [id number] Date of Birth: 05-31-1960  Today's Date: 03/04/2016 PT Individual Time: 1005-1105 PT Individual Time Calculation (min): 60 min    Short Term Goals: Week 1:  PT Short Term Goal 1 (Week 1): Pt will perform bed mobility S PT Short Term Goal 2 (Week 1): Pt will perform sit <>stand with consistent minA PT Short Term Goal 3 (Week 1): Pt will perform stand pivot transfers with minA PT Short Term Goal 4 (Week 1): Pt will perform gait with RW and min guard x75' PT Short Term Goal 5 (Week 1): Pt will initiate stair training  Skilled Therapeutic Interventions/Progress Updates:    Pt was seen bedside in the am. Pt transferred supine to edge of bed with side rails and S. Pt performed multiple sit to stand transfers with S and rolling walker. Pt perform toilet transfers with rolling walker and grab bar with S. Pt performed multiple stand pivot transfers with rolling walker and S. Pt ambulated 150 and 228 feet with rolling walker and S. Pt ambulated 480 feet with rolling walker and S to min guard with 1 standing rest break. Pt performed cone taps, criss cross cone taps, alternating cone taps, criss cross alternating cone taps, 3 sets x 10 reps each for LE strengthening. Pt returned to room and left sitting up on edge of bed with pt's wife at bedside.   Therapy Documentation Precautions:  Precautions Precautions: Fall Restrictions Weight Bearing Restrictions: No General:   Pain: No c/o pain.   See Function Navigator for Current Functional Status.   Therapy/Group: Individual Therapy  Rayford HalstedMitchell, Capucine Tryon G 03/04/2016, 11:34 AM

## 2016-03-05 ENCOUNTER — Inpatient Hospital Stay (HOSPITAL_COMMUNITY): Payer: Medicare Other

## 2016-03-05 ENCOUNTER — Encounter (HOSPITAL_COMMUNITY): Payer: Medicare Other

## 2016-03-05 ENCOUNTER — Inpatient Hospital Stay (HOSPITAL_COMMUNITY): Payer: Medicare Other | Admitting: Physical Therapy

## 2016-03-05 LAB — RENAL FUNCTION PANEL
Albumin: 2.8 g/dL — ABNORMAL LOW (ref 3.5–5.0)
Anion gap: 11 (ref 5–15)
BUN: 43 mg/dL — ABNORMAL HIGH (ref 6–20)
CALCIUM: 9.6 mg/dL (ref 8.9–10.3)
CHLORIDE: 104 mmol/L (ref 101–111)
CO2: 24 mmol/L (ref 22–32)
CREATININE: 7.96 mg/dL — AB (ref 0.61–1.24)
GFR calc Af Amer: 8 mL/min — ABNORMAL LOW (ref >60)
GFR calc non Af Amer: 7 mL/min — ABNORMAL LOW (ref >60)
GLUCOSE: 107 mg/dL — AB (ref 65–99)
Phosphorus: 4.4 mg/dL (ref 2.5–4.6)
Potassium: 4.4 mmol/L (ref 3.5–5.1)
SODIUM: 139 mmol/L (ref 135–145)

## 2016-03-05 LAB — CBC
HCT: 25.4 % — ABNORMAL LOW (ref 39.0–52.0)
Hemoglobin: 8 g/dL — ABNORMAL LOW (ref 13.0–17.0)
MCH: 27.9 pg (ref 26.0–34.0)
MCHC: 31.5 g/dL (ref 30.0–36.0)
MCV: 88.5 fL (ref 78.0–100.0)
PLATELETS: 164 10*3/uL (ref 150–400)
RBC: 2.87 MIL/uL — ABNORMAL LOW (ref 4.22–5.81)
RDW: 16 % — AB (ref 11.5–15.5)
WBC: 5.1 10*3/uL (ref 4.0–10.5)

## 2016-03-05 LAB — GLUCOSE, CAPILLARY
GLUCOSE-CAPILLARY: 72 mg/dL (ref 65–99)
Glucose-Capillary: 89 mg/dL (ref 65–99)
Glucose-Capillary: 140 mg/dL — ABNORMAL HIGH (ref 65–99)

## 2016-03-05 NOTE — Procedures (Signed)
Patient seen and examined on Hemodialysis. QB 400, UF goal 2.5 L.  Wants to go back to PD. Treatment adjusted as needed.  Bufford ButtnerElizabeth Mandel Seiden MD [facility]. pgr [phone] Cell [phone] 5:08 PM

## 2016-03-05 NOTE — Progress Notes (Addendum)
Physical Therapy Session Note  Patient Details  Name: [name] [name] MRN: [id number] Date of Birth: 1960-05-27  Today's Date: 03/05/2016 PT Individual Time: 1010-1106 and [id number] PT Individual Time Calculation (min): 56 min and 26 min   Short Term Goals: Week 1:  PT Short Term Goal 1 (Week 1): Pt will perform bed mobility S PT Short Term Goal 2 (Week 1): Pt will perform sit <>stand with consistent minA PT Short Term Goal 3 (Week 1): Pt will perform stand pivot transfers with minA PT Short Term Goal 4 (Week 1): Pt will perform gait with RW and min guard x75' PT Short Term Goal 5 (Week 1): Pt will initiate stair training  Skilled Therapeutic Interventions/Progress Updates:    Treatment 1: Pt received sitting on EOB & agreeable to tx, denying c/o pain. During session pt ambulated throughout unit with RW & supervision with decreased gait speed & occasional standing rest breaks 2/2 fatigue. Pt performed all transfers (sit<>stand and car from SUV simulated height) with RW & supervision overall. Utilized cybex kinetron in sitting up to 10-20 cm/sec focusing on BLE strengthening & endurance training. High level balance training completed by having pt ambulate 30 ft x 3 trials without AD & mod fading to min assist; pt with decreased pelvic rotation & weight shifting and notes fatigue quickly with task. Pt reports he has 3 steps (2 steps + 1 to get onto porch) to enter home with a railing running perpendicular to steps that he can access once he ascends first step, otherwise pt has no rails/hand support. Educated pt on stair negotiation with various methods (HHA, RW backwards) with mod assist overall and max cuing for compensatory technique. Pt anxious & fearful when negotiating stairs in either manner 2/2 impaired balance and will benefit from continued practice and problem solving with stair negotiation. At end of session pt left sitting on EOB with all needs within reach; pt aware to call for  assistance when wanting to transfer OOB.  Treatment 2: Patient received in bathroom and waiting for supervision for transfer off of toilet. Pt transferred off of toilet with supervision and performed hand hygiene at sink with supervision assist for standing balance. Pt noted back & BLE muscle ache 5-6/10 & RN made aware. Pt ambulated around nurses station to gym with RW & supervision for endurance training purposes, with gait speed decreasing with fatigue, as well as pt demonstrating increased reliance on BUE. Educated pt on proper positioning for ambulating within base of RW as well as upright posture with good demo by pt with cuing. Pt completed multiple sit<>stand transfers without BUE & min assist with cuing for anterior weight shift and to push up with BLE instead of pushing back onto mat; focus of activity was to strengthen BLE. Pt ambulated back to room with RW & supervision & left with nursing student present.  During session, educated pt on benefits of drinking water versus diet soda, as well as HHPT follow up after d/c.  Therapy Documentation Precautions:  Precautions Precautions: Fall Restrictions Weight Bearing Restrictions: No   See Function Navigator for Current Functional Status.   Therapy/Group: Individual Therapy  [name] [name] 03/05/2016, 11:40 AM

## 2016-03-05 NOTE — Progress Notes (Signed)
[facility] Rounding Note  Subjective/Interval History:   Feels well.  Happy that he is getting stronger every day.  For HD this PM.   Objective Vital signs in last 24 hours: Vitals:   03/04/16 1500 03/04/16 1753 03/05/16 0531 03/05/16 0610  BP: 131/61 (!) 111/41  126/60  Pulse: 75 75  75  Resp: 18 18  18   Temp: 98.6 F (37 C) 98.8 F (37.1 C)  98.2 F (36.8 C)  TempSrc: Oral Oral Oral Oral  SpO2: 99%   98%  Weight:    106.6 kg (235 lb)  Height:       Weight change: 0.454 kg (1 lb)  Intake/Output Summary (Last 24 hours) at 03/05/16 1228 Last data filed at 03/04/16 1700  Gross per 24 hour  Intake              480 ml  Output                0 ml  Net              480 ml   Physical Exam:  Blood pressure 126/60, pulse 75, temperature 98.2 F (36.8 C), temperature source Oral, resp. rate 18, height 5\' 9"  (1.753 m), weight 106.6 kg (235 lb), SpO2 98 %.  GEN: well appearing today HEENT: EOMI, PERRL, no JVD Cv: RRR LUNGS: clear ABD: PD cath in place RLQ with dressing, appears old EXT: 1+ LE edema, L > R ACCESS: RUE AVF good bruit and thrill  Labs:  Recent Labs Lab 02/29/16 0819 03/02/16 1540  NA 138 139  K 4.0 4.7  CL 100* 102  CO2 27 28  GLUCOSE 90 107*  BUN 29* 40*  CREATININE 5.24* 7.35*  CALCIUM 9.4 10.3  PHOS 3.9 4.0     Recent Labs Lab 02/29/16 0819 03/02/16 1540  ALBUMIN 2.3* 2.5*     Recent Labs Lab 02/29/16 0819 03/02/16 1541  WBC 6.7 7.2  HGB 8.0* 7.9*  HCT 26.0* 25.7*  MCV 88.7 88.9  PLT 211 194     Recent Labs Lab 03/04/16 1152 03/04/16 1646 03/04/16 2046 03/05/16 0637 03/05/16 1214  GLUCAP 82 79 147* 72 89     Medications:  . allopurinol  100 mg Oral Daily  . atorvastatin  40 mg Oral q1800  . carvedilol  18.75 mg Oral BID WC  . cholecalciferol  1,000 Units Oral Daily  . cinacalcet  60 mg Oral Q supper  . [START ON 03/07/2016] darbepoetin (ARANESP) injection - DIALYSIS  100 mcg Intravenous Q Wed-HD  . famotidine  20 mg Oral  Daily  . [START ON 03/07/2016] ferric gluconate (FERRLECIT/NULECIT) IV  62.5 mg Intravenous Q Wed-HD  . gentamicin ointment   Topical TID  . heparin  5,000 Units Subcutaneous Q8H  . insulin aspart  0-5 Units Subcutaneous QHS  . insulin aspart  0-9 Units Subcutaneous TID WC  . insulin glargine  16 Units Subcutaneous BID  . lidocaine  2 patch Transdermal Q24H  . modafinil  100 mg Oral Daily  . polyethylene glycol  17 g Oral BID  . pregabalin  75 mg Oral Daily  . sevelamer carbonate  1,600 mg Oral TID WC  . 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 [facility] [place] [facility] found to have SDH and during admission had  cardiac arrest 2/2 aspiration, MSSA PNA, CDiff.  Prev on PD (still has catheter), but currently getting HD via his backup AVF (last intervention 02/15/16 thrombolysis) Was prev at [facility] now in CIR for rehab.    Assessment/Recommendations  1. ESRD 1. PD as outpt up until admitted 2. Transitioned to HD TIW during admit using RUE AVF  3. Outpt nephrology is Dr. Kayren EavesErnest Johnson -- [place] 4. Cont MWF HD Sessions via RUE AVF in PM: 2K, 2 Ca, No heparin, Qb 400.  Go to 15 ga needles Prev used at [facility] without issues).  EDW approx 102.5. For HD today. 5. Arranged weekly PD catheter flushing with 6E nursing - done 11/11, only 400cc fluid returned so will need to keep an eye on that. - still needs dressing changes, have ordered "exit site care". 6. Will see what best plan is closer to DC re PD vs HD; he wishes to return to PD when home for CIR 7. 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 Stop phoslo, increase sevelemer, continue sensipar. Change to 2Ca bath with HD 4. Anemia of CKD and Chronic Illness -- on Darbe 100 QWed;  last TSAT 22% and ferritin 599 (02/10/16). Add weekly iron dose. 5. HTN, stable 6. DM2   Bufford ButtnerElizabeth Bellarose Burtt, MD [facility] pgr [phone] Cell [phone] 03/05/2016, 12:28 PM

## 2016-03-05 NOTE — Progress Notes (Signed)
Occupational Therapy Session Note  Patient Details  Name: [name] [name] MRN: [id number] Date of Birth: 1961/01/09  Today's Date: 03/05/2016 OT Individual Time: [id number] OT Individual Time Calculation (min): 58 min     Short Term Goals: Week 1:  OT Short Term Goal 1 (Week 1): Pt will perform shower transfer with steady assistance in order to increase I with task. OT Short Term Goal 2 (Week 1): Pt will perform LB dressing with min A in order to increase I with self care. OT Short Term Goal 3 (Week 1): Pt will demonstrate proper technique of HEP for B UE strengthening with use of paper handout . OT Short Term Goal 4 (Week 1): Pt will perform toileting with steady assistance in order to increase I with functional task.   Skilled Therapeutic Interventions/Progress Updates:    Pt sitting EOB upon arrival.  Pt amb with RW to bathroom for shower and dressing before returning to sink and completing grooming tasks seated in w/c at sink.  Pt completed all tasks at supervision level with no unsafe behaviors noted.  Pt transitioned to functional amb with RW for simple home mgmt tasks. Pt amb with RW to ADL apartment and practiced tub bench transfers at supervision level before returning to room with standing rest breaks X 2.  Pt returned to sitting EOB with bed alarm activated and all needs within reach.  Focus on activity tolerance, sit<>stand, standing balance, functional amb with  RW, discharge planning and safety awareness to increase independence with BADLs.  Therapy Documentation Precautions:  Precautions Precautions: Fall Restrictions Weight Bearing Restrictions: No Pain:  Pt denied pain  See Function Navigator for Current Functional Status.   Therapy/Group: Individual Therapy  Rich BraveLanier, Camrin Gearheart Chappell 03/05/2016, 8:05 AM

## 2016-03-05 NOTE — Progress Notes (Signed)
Note:  The case manager from [facility] says that she was working with Delena Serveavita in [place] for outpatient HD for patient.  Contact is Rayfield CitizenCaroline at [phone] for available chair time and additional information as needed.  I have shared this information with rehab social worker to assist with discharge planning.  #[phone]

## 2016-03-05 NOTE — Progress Notes (Signed)
Occupational Therapy Note  Patient Details  Name: [name] [name] MRN: [id number] Date of Birth: 09/21/1960  Today's Date: 03/05/2016 OT Individual Time: 1100-1200 OT Individual Time Calculation (min): 60 min    Pt denied pain Individual Therapy  Pt amb with RW to gym and engaged in dynamic standing activities including retrieving/returning objects from surface outside BOS while standing on compliant surface.  Pt engaged in functional amb tasks with RW to complete simple home mgmt tasks.  Pt returned to room and remained seated on EOB awaiting lunch.   [name], [name] 03/05/2016, 12:11 PM

## 2016-03-05 NOTE — Progress Notes (Signed)
[place] PHYSICAL MEDICINE & REHABILITATION     PROGRESS NOTE    Subjective/Complaints: Pain much improved. Coming off meds. Trying not to take oxycodone.   ROS: Pt denies fever, rash/itching, headache, blurred or double vision, nausea, vomiting, abdominal pain, diarrhea, chest pain, shortness of breath, palpitations, dysuria, dizziness,   bleeding, anxiety, or depression     Objective: Vital Signs: Blood pressure 126/60, pulse 75, temperature 98.2 F (36.8 C), temperature source Oral, resp. rate 18, height 5\' 9"  (1.753 m), weight 106.6 kg (235 lb), SpO2 98 %. No results found.  Recent Labs  03/02/16 1541  WBC 7.2  HGB 7.9*  HCT 25.7*  PLT 194    Recent Labs  03/02/16 1540  NA 139  K 4.7  CL 102  GLUCOSE 107*  BUN 40*  CREATININE 7.35*  CALCIUM 10.3   CBG (last 3)   Recent Labs  03/04/16 1646 03/04/16 2046 03/05/16 0637  GLUCAP 79 147* 72    Wt Readings from Last 3 Encounters:  03/05/16 106.6 kg (235 lb)  02/28/16 115.2 kg (254 lb)    Physical Exam:  Constitutional: NAD.   HENT:  Head: Normocephalic and atraumatic.  Mouth/Throat: Oropharynx moist Eyes: Conjunctivae and EOM are normal. Pupils are equal, round, and reactive to light.  Neck: Normal range of motion. supple.  Cardiovascular: Normal rate and regular rhythm. No JVD  Murmur + Respiratory: Effort normal and breath sounds normal. No wheezes.    GI: Soft. Bowel sounds are normal. He exhibits no distension. There is tenderness ?RLQ --near Peritoneal dialysis catheter in place with dry dressing intact, no drainage Musculoskeletal: He exhibits no edema or tenderness. tr edema, Left lower extremity without tenderness, no erythema, no swelling. Bilateral feet. Without edema or skin breakdown.  Neurological: He is alert and oriented to person, place, and time. No cranial nerve deficit. appropriate Motor: 4/5 grossly throughout all 4 limbs prox to distal.  Skin: Skin is warm and dry. AVG RUE  intact Psychiatric:sl impulsive  Assessment/Plan: 1. Functional deficits secondary to TBI which require 3+ hours per day of interdisciplinary therapy in a comprehensive inpatient rehab setting. Physiatrist is providing close team supervision and 24 hour management of active medical problems listed below. Physiatrist and rehab team continue to assess barriers to discharge/monitor patient progress toward functional and medical goals.  Function:  Bathing Bathing position   Position: Shower  Bathing parts Body parts bathed by patient: Right arm, Left arm, Chest, Abdomen, Front perineal area, Buttocks, Right upper leg, Left upper leg, Right lower leg, Left lower leg, Back Body parts bathed by helper: Right lower leg, Left lower leg, Back  Bathing assist Assist Level: Supervision or verbal cues      Upper Body Dressing/Undressing Upper body dressing   What is the patient wearing?: Pull over shirt/dress     Pull over shirt/dress - Perfomed by patient: Thread/unthread right sleeve, Thread/unthread left sleeve, Put head through opening, Pull shirt over trunk          Upper body assist Assist Level: Supervision or verbal cues      Lower Body Dressing/Undressing Lower body dressing   What is the patient wearing?: Pants, Non-skid slipper socks, Shoes     Pants- Performed by patient: Thread/unthread right pants leg, Thread/unthread left pants leg, Pull pants up/down Pants- Performed by helper: Thread/unthread left pants leg Non-skid slipper socks- Performed by patient: Don/doff right sock, Don/doff left sock Non-skid slipper socks- Performed by helper: Don/doff right sock, Don/doff left sock  Shoes - Performed by patient: Don/doff right shoe, Don/doff left shoe, Fasten right, Fasten left            Lower body assist Assist for lower body dressing: Touching or steadying assistance (Pt > 75%)      Toileting Toileting   Toileting steps completed by patient: Adjust clothing  prior to toileting, Performs perineal hygiene, Adjust clothing after toileting   Toileting Assistive Devices: Grab bar or rail  Toileting assist Assist level: Touching or steadying assistance (Pt.75%)   Transfers Chair/bed transfer   Chair/bed transfer method: Ambulatory Chair/bed transfer assist level: Supervision or verbal cues Chair/bed transfer assistive device: Patent attorneyWalker     Locomotion Ambulation     Max distance: 228 Assist level: Supervision or verbal cues   Wheelchair     Max wheelchair distance: 200 Assist Level: Supervision or verbal cues  Cognition Comprehension Comprehension assist level: Follows complex conversation/direction with no assist  Expression Expression assist level: Expresses complex ideas: With no assist  Social Interaction Social Interaction assist level: Interacts appropriately with others - No medications needed.  Problem Solving Problem solving assist level: Solves complex problems: Recognizes & self-corrects  Memory Memory assist level: Complete Independence: No helper   Medical Problem List and Plan: 1.  Weakness, mobility deficits, difficulty completing ADLs secondary to TBI and multimedical.  -continue inpatient therapies, PT, OT, speech  -progressing towards goals 2.  DVT Prophylaxis/Anticoagulation: lovenox 3. Pain Management:  -Lyrica initiated for neuropathic pain.  -doing well with oxycodone 5-10mg  q8 prn --weaning to off 4. Mood: Motivated to get better and get home. LCSW to follow for evaluation and support.  5. Neuropsych: This patient is capable of making decisions on his own behalf. 6. Skin/Wound Care: Routine pressure relief measures.  7. Fluids/Electrolytes/Nutrition:  Strict I/O. Check daily weights. 1200 cc FR/day.   -labs  with HD MWF 8. T2DM with nerupathy:   Continue lantus bid with SSI for elevated BS.    -sugars reviewed and remain well controlled at present.  9. OSA: CPAP, weaning oxygen. 10. ESRD:  HD MWF later in day to  help with therapy completion. Nephrology following 11. Gout: Managed on allopurinol. No current evidence of gout flare 12. Anemia of chronic disease: Monitor serially and transfuse if needed.  -continue aranesp weekly as well as iron for supplement.   -hgb around 8.0 13. HTN: Monitor BID 14. Morbid Obesity:               LOS (Days) 5 A FACE TO FACE EVALUATION WAS PERFORMED  SWARTZ,ZACHARY T 03/05/2016 8:37 AM

## 2016-03-06 ENCOUNTER — Inpatient Hospital Stay (HOSPITAL_COMMUNITY): Payer: Medicare Other | Admitting: Occupational Therapy

## 2016-03-06 ENCOUNTER — Inpatient Hospital Stay (HOSPITAL_COMMUNITY): Payer: Medicare Other

## 2016-03-06 ENCOUNTER — Inpatient Hospital Stay (HOSPITAL_COMMUNITY): Payer: Medicare Other | Admitting: Physical Therapy

## 2016-03-06 LAB — CBC
HEMATOCRIT: 25.7 % — AB (ref 39.0–52.0)
Hemoglobin: 7.9 g/dL — ABNORMAL LOW (ref 13.0–17.0)
MCH: 27.6 pg (ref 26.0–34.0)
MCHC: 30.7 g/dL (ref 30.0–36.0)
MCV: 89.9 fL (ref 78.0–100.0)
PLATELETS: 162 10*3/uL (ref 150–400)
RBC: 2.86 MIL/uL — ABNORMAL LOW (ref 4.22–5.81)
RDW: 15.9 % — AB (ref 11.5–15.5)
WBC: 5.3 10*3/uL (ref 4.0–10.5)

## 2016-03-06 LAB — GLUCOSE, CAPILLARY
GLUCOSE-CAPILLARY: 70 mg/dL (ref 65–99)
GLUCOSE-CAPILLARY: 76 mg/dL (ref 65–99)
GLUCOSE-CAPILLARY: 149 mg/dL — AB (ref 65–99)
GLUCOSE-CAPILLARY: 88 mg/dL (ref 65–99)

## 2016-03-06 LAB — RENAL FUNCTION PANEL
ALBUMIN: 2.8 g/dL — AB (ref 3.5–5.0)
Anion gap: 9 (ref 5–15)
BUN: 32 mg/dL — AB (ref 6–20)
CALCIUM: 9.9 mg/dL (ref 8.9–10.3)
CO2: 28 mmol/L (ref 22–32)
CREATININE: 6.85 mg/dL — AB (ref 0.61–1.24)
Chloride: 102 mmol/L (ref 101–111)
GFR calc Af Amer: 9 mL/min — ABNORMAL LOW (ref >60)
GFR, EST NON AFRICAN AMERICAN: 8 mL/min — AB (ref >60)
GLUCOSE: 156 mg/dL — AB (ref 65–99)
PHOSPHORUS: 5.1 mg/dL — AB (ref 2.5–4.6)
POTASSIUM: 4.3 mmol/L (ref 3.5–5.1)
SODIUM: 139 mmol/L (ref 135–145)

## 2016-03-06 MED ORDER — HEPARIN SODIUM (PORCINE) 1000 UNIT/ML DIALYSIS
1000.0000 [IU] | INTRAMUSCULAR | Status: DC | PRN
  Filled 2016-03-06: qty 1

## 2016-03-06 MED ORDER — PENTAFLUOROPROP-TETRAFLUOROETH EX AERO: 1.0000 "application " | INHALATION_SPRAY | CUTANEOUS | Status: DC | PRN

## 2016-03-06 MED ORDER — ALTEPLASE 2 MG IJ SOLR: 2.0000 mg | Freq: Once | INTRAMUSCULAR | Status: DC | PRN

## 2016-03-06 MED ORDER — LIDOCAINE-PRILOCAINE 2.5-2.5 % EX CREA
1.0000 "application " | TOPICAL_CREAM | CUTANEOUS | Status: DC | PRN
  Filled 2016-03-06: qty 5

## 2016-03-06 MED ORDER — SODIUM CHLORIDE 0.9 % IV SOLN: 100.0000 mL | INTRAVENOUS | Status: DC | PRN

## 2016-03-06 MED ORDER — INSULIN GLARGINE 100 UNIT/ML ~~LOC~~ SOLN
10.0000 [IU] | Freq: Two times a day (BID) | SUBCUTANEOUS | Status: DC
  Administered 2016-03-06 – 2016-03-09 (×7): 10 [IU] via SUBCUTANEOUS
  Filled 2016-03-06 (×9): qty 0.1

## 2016-03-06 MED ORDER — LIDOCAINE HCL (PF) 1 % IJ SOLN
5.0000 mL | INTRAMUSCULAR | Status: DC | PRN
  Filled 2016-03-06: qty 5

## 2016-03-06 NOTE — Progress Notes (Signed)
Occupational Therapy Session Note  Patient Details  Name: [name] BasemanLonnie L Guthrie MRN: [id number] Date of Birth: September 28, 1960  Today's Date: 03/06/2016 OT Individual Time: 1400-1430 OT Individual Time Calculation (min): 30 min     Short Term Goals: Week 1:  OT Short Term Goal 1 (Week 1): Pt will perform shower transfer with steady assistance in order to increase I with task. OT Short Term Goal 2 (Week 1): Pt will perform LB dressing with min A in order to increase I with self care. OT Short Term Goal 3 (Week 1): Pt will demonstrate proper technique of HEP for B UE strengthening with use of paper handout . OT Short Term Goal 4 (Week 1): Pt will perform toileting with steady assistance in order to increase I with functional task.   Skilled Therapeutic Interventions/Progress Updates:     Upon entering the room, pt seated on EOB with RN present in room giving medications. Pt with no c/o pain this session and motivated to participate. Pt performed stand pivot from bed > wheelchair with supervision and use of RW. Pt  Propelled wheelchair with B UEs and LEs secondary to time management. OT educated and demonstrated kitchen mobility tasks with use of RW. Pt returned demonstrations with supervision for safety and min verbal cues for technique. Pt returned to wheelchair at end of session and propelled back to room. Call bell and all needed items within reach upon exiting the room.   Therapy Documentation Precautions:  Precautions Precautions: Fall Restrictions Weight Bearing Restrictions: No General:   Vital Signs: Therapy Vitals Temp: 98.5 F (36.9 C) Temp Source: Oral Pulse Rate: 87 Resp: 18 BP: 107/62 Oxygen Therapy SpO2: 97 % O2 Device: Not Delivered Pain:   ADL:   Exercises:   Other Treatments:    See Function Navigator for Current Functional Status.   Therapy/Group: Individual Therapy  Alen BleacherBradsher, Coburn Knaus P 03/06/2016, 4:14 PM

## 2016-03-06 NOTE — Progress Notes (Signed)
Occupational Therapy Note  Patient Details  Name: [name] [name] MRN: [id number] Date of Birth: 11/02/1960  Today's Date: 03/06/2016 OT Individual Time: 1100-1200 OT Individual Time Calculation (min): 60 min    Pt c/o B knee pain; RN aware and repositioned Individual Therapy  Pt amb with RW to ADL apartment and initially engaged in discharge planning and problem solving in regards to laundry tasks.  Several options discussed.  Recommended that pt wait until home health therapy initiates treatments to reengage in task at home.  Pt transitioned to standing balance activities on Wii balance board using RW for BUE support.  Pt transitioned to Wii bowling without UE support.  Pt continues to exhibit delayed balance reactions with/without RW.  Focus on discharge planning, home safety education, activity tolerance, sit<>stand from lower surfaces, standing balance, and safety awareness to increase independence with BADLs.   [name], Shephanie Romas [facility] 03/06/2016, 12:11 PM

## 2016-03-06 NOTE — Progress Notes (Signed)
[facility] Rounding Note  Subjective/Interval History:   Feels well. Wife at bedside.  D/c planned for Friday.  Getting dressing changes.  HD yesterday with some venous alarms  Objective Vital signs in last 24 hours: Vitals:   03/05/16 1940 03/05/16 2029 03/06/16 0516 03/06/16 1407  BP: 102/65 (!) 119/59 114/69 107/62  Pulse: 75 71 61 87  Resp: 16  18 18   Temp:   98 F (36.7 C) 98.5 F (36.9 C)  TempSrc:   Oral Oral  SpO2:  97% 97% 97%  Weight: 107 kg (235 lb 14.3 oz)  106 kg (233 lb 11 oz)   Height:       Weight change: 1.405 kg (3 lb 1.6 oz)  Intake/Output Summary (Last 24 hours) at 03/06/16 2109 Last data filed at 03/06/16 1700  Gross per 24 hour  Intake              600 ml  Output                0 ml  Net              600 ml   Physical Exam:  Blood pressure 107/62, pulse 87, temperature 98.5 F (36.9 C), temperature source Oral, resp. rate 18, height 5\' 9"  (1.753 m), weight 106 kg (233 lb 11 oz), SpO2 97 %.  GEN: well appearing  HEENT: EOMI, PERRL, no JVD Cv: RRR LUNGS: clear ABD: PD cath in place RLQ with dressing, clean/dry/intact.  No tenderness. EXT: 1+ LE edema, L > R ACCESS: RUE AVF good bruit and thrill  Labs:  Recent Labs Lab 02/29/16 0819 03/02/16 1540 03/05/16 1505 03/06/16 1628  NA 138 139 139 139  K 4.0 4.7 4.4 4.3  CL 100* 102 104 102  CO2 27 28 24 28   GLUCOSE 90 107* 107* 156*  BUN 29* 40* 43* 32*  CREATININE 5.24* 7.35* 7.96* 6.85*  CALCIUM 9.4 10.3 9.6 9.9  PHOS 3.9 4.0 4.4 5.1*     Recent Labs Lab 03/02/16 1540 03/05/16 1505 03/06/16 1628  ALBUMIN 2.5* 2.8* 2.8*     Recent Labs Lab 02/29/16 0819 03/02/16 1541 03/05/16 1505 03/06/16 1628  WBC 6.7 7.2 5.1 5.3  HGB 8.0* 7.9* 8.0* 7.9*  HCT 26.0* 25.7* 25.4* 25.7*  MCV 88.7 88.9 88.5 89.9  PLT 211 194 164 162     Recent Labs Lab 03/05/16 2140 03/06/16 0636 03/06/16 1202 03/06/16 1615 03/06/16 2101  GLUCAP 140* 70 76 149* 88     Medications:  . allopurinol  100  mg Oral Daily  . atorvastatin  40 mg Oral q1800  . carvedilol  18.75 mg Oral BID WC  . cholecalciferol  1,000 Units Oral Daily  . cinacalcet  60 mg Oral Q supper  . [START ON 03/07/2016] darbepoetin (ARANESP) injection - DIALYSIS  100 mcg Intravenous Q Wed-HD  . famotidine  20 mg Oral Daily  . [START ON 03/07/2016] ferric gluconate (FERRLECIT/NULECIT) IV  62.5 mg Intravenous Q Wed-HD  . gentamicin ointment   Topical TID  . heparin  5,000 Units Subcutaneous Q8H  . insulin aspart  0-5 Units Subcutaneous QHS  . insulin aspart  0-9 Units Subcutaneous TID WC  . insulin glargine  10 Units Subcutaneous BID  . lidocaine  2 patch Transdermal Q24H  . modafinil  100 mg Oral Daily  . polyethylene glycol  17 g Oral BID  . pregabalin  75 mg Oral Daily  . sevelamer carbonate  1,600 mg Oral TID  WC  . 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 [place] - [facility] 9/19 found to have SDH and during admission had cardiac arrest 2/2 aspiration, MSSA PNA, CDiff.  Prev on PD (still has catheter), but currently getting HD via his backup AVF (last intervention 02/15/16 thrombolysis) Was prev at [facility] now in CIR for rehab.    Assessment/Recommendations  1. ESRD 1. PD as outpt up until admitted 2. Transitioned to HD TIW during admit using RUE AVF  3. Outpt nephrology is Dr. [facility]ren [facility] -- [place] 4. Cont MWF HD Sessions via RUE AVF in PM: 2K, 2 Ca, No heparin, Qb 400.  Go to 15 ga needles Prev used at [facility] without issues).  EDW approx 102.5. For HD 11/15. 5. Arranged weekly PD catheter flushing with 6E nursing - done 11/11, only 400cc fluid returned so will need to keep an eye on that. - getting dressing changes.  Will arrange one more flush before d/c.   6. Will see what best plan is closer to DC re PD vs HD; he wishes to return to PD when home for  CIR 7. 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 Stop phoslo, increase sevelemer, continue sensipar. Change to 2Ca bath with HD 4. Anemia of CKD and Chronic Illness -- on Darbe 100 QWed; last TSAT 22% and ferritin 599 (02/10/16). Add weekly iron dose. 5. HTN, stable 6. DM2   Bufford ButtnerElizabeth Lisel Siegrist, MD [facility] pgr [phone] Cell [phone] 03/06/2016, 9:09 PM

## 2016-03-06 NOTE — Progress Notes (Addendum)
Physical Therapy Session Note  Patient Details  Name: [name] BasemanLonnie L Rotan MRN: [id number] Date of Birth: March 14, 1961  Today's Date: 03/06/2016 PT Individual Time: 0905-1001 PT Individual Time Calculation (min): 56 min    Short Term Goals: Week 1:  PT Short Term Goal 1 (Week 1): Pt will perform bed mobility S PT Short Term Goal 2 (Week 1): Pt will perform sit <>stand with consistent minA PT Short Term Goal 3 (Week 1): Pt will perform stand pivot transfers with minA PT Short Term Goal 4 (Week 1): Pt will perform gait with RW and min guard x75' PT Short Term Goal 5 (Week 1): Pt will initiate stair training  Skilled Therapeutic Interventions/Progress Updates:    Pt received in bed noting 4-5/10 soreness in B knees but premedicated & agreeable to tx. Pt noted need to use restroom & ambulated throughout room/bathroom without AD & min assist overall for balance training. Pt completed toilet transfers & hand hygiene standing at sink with supervision overall. Throughout session pt ambulated throughout unit with RW & supervision, requiring standing rest breaks as needed 2/2 fatigue. Utilized dynavision in standing without BUE support & close supervision for endurance & balance training (2 minutes + 2 minutes). Utilized biodex limits of stability & ball toss game with BUE fading to 1 UE support and therapist initially providing manual facilitation for task. Task focused on ankle strategies & overall balance; pt noted to have some anxiety with task that was improving as activity progressed. At end of session pt left sitting on EOB in room with all needs within reach.   Therapy Documentation Precautions:  Precautions Precautions: Fall Restrictions Weight Bearing Restrictions: No   See Function Navigator for Current Functional Status.   Therapy/Group: Individual Therapy  Sandi MariscalVictoria M Jaamal Farooqui 03/06/2016, 5:17 PM

## 2016-03-06 NOTE — Progress Notes (Signed)
[facility] PHYSICAL MEDICINE & REHABILITATION     PROGRESS NOTE    Subjective/Complaints: Up in BR. No new complaints. Excited about progress   ROS: Pt denies fever, rash/itching, headache, blurred or double vision, nausea, vomiting, abdominal pain, diarrhea, chest pain, shortness of breath, palpitations, dysuria, dizziness,   bleeding, anxiety, or depression     Objective: Vital Signs: Blood pressure 114/69, pulse 61, temperature 98 F (36.7 C), temperature source Oral, resp. rate 18, height 5\' 9"  (1.753 m), weight 106 kg (233 lb 11 oz), SpO2 97 %. No results found.  Recent Labs  03/05/16 1505  WBC 5.1  HGB 8.0*  HCT 25.4*  PLT 164    Recent Labs  03/05/16 1505  NA 139  K 4.4  CL 104  GLUCOSE 107*  BUN 43*  CREATININE 7.96*  CALCIUM 9.6   CBG (last 3)   Recent Labs  03/05/16 1214 03/05/16 2140 03/06/16 0636  GLUCAP 89 140* 70    Wt Readings from Last 3 Encounters:  03/06/16 106 kg (233 lb 11 oz)  02/28/16 115.2 kg (254 lb)    Physical Exam:  Constitutional: NAD.   HENT:  Head: Normocephalic and atraumatic.  Mouth/Throat: Oropharynx moist Eyes: PERRL Neck: Normal range of motion. supple.  Cardiovascular: Normal rate and regular rhythm. No JVD  Murmur present syst Respiratory: CTA.    GI: Soft. Bowel sounds are normal. He exhibits no distension. There is tenderness ?RLQ --near Peritoneal dialysis catheter in place with dry dressing intact Musculoskeletal: He exhibits no edema or tenderness. tr edema, Left lower extremity without tenderness, no erythema, no swelling. Bilateral feet. Without edema or skin breakdown.  Neurological: He is alert and oriented to person, place, and time. No cranial nerve deficit. appropriate Motor: 4+/5 grossly throughout all 4 limbs prox to distal. No sensory findings Skin: Skin is warm and dry. AVG RUE intact with thrill Psychiatric:sl impulsive still  Assessment/Plan: 1. Functional deficits secondary to TBI which  require 3+ hours per day of interdisciplinary therapy in a comprehensive inpatient rehab setting. Physiatrist is providing close team supervision and 24 hour management of active medical problems listed below. Physiatrist and rehab team continue to assess barriers to discharge/monitor patient progress toward functional and medical goals.  Function:  Bathing Bathing position   Position: Shower  Bathing parts Body parts bathed by patient: Right arm, Left arm, Chest, Abdomen, Front perineal area, Buttocks, Right upper leg, Left upper leg, Right lower leg, Left lower leg, Back Body parts bathed by helper: Right lower leg, Left lower leg, Back  Bathing assist Assist Level: Supervision or verbal cues      Upper Body Dressing/Undressing Upper body dressing   What is the patient wearing?: Pull over shirt/dress     Pull over shirt/dress - Perfomed by patient: Thread/unthread right sleeve, Thread/unthread left sleeve, Put head through opening, Pull shirt over trunk          Upper body assist Assist Level: Supervision or verbal cues      Lower Body Dressing/Undressing Lower body dressing   What is the patient wearing?: Pants, Non-skid slipper socks, Shoes     Pants- Performed by patient: Thread/unthread right pants leg, Thread/unthread left pants leg, Pull pants up/down Pants- Performed by helper: Thread/unthread left pants leg Non-skid slipper socks- Performed by patient: Don/doff right sock, Don/doff left sock Non-skid slipper socks- Performed by helper: Don/doff right sock, Don/doff left sock     Shoes - Performed by patient: Don/doff right shoe, Don/doff left shoe, Fasten  right, Fasten left            Lower body assist Assist for lower body dressing: Touching or steadying assistance (Pt > 75%)      Toileting Toileting   Toileting steps completed by patient: Adjust clothing prior to toileting, Performs perineal hygiene, Adjust clothing after toileting Toileting steps  completed by helper: Adjust clothing prior to toileting, Performs perineal hygiene, Adjust clothing after toileting Toileting Assistive Devices: Grab bar or rail  Toileting assist Assist level: Touching or steadying assistance (Pt.75%)   Transfers Chair/bed transfer   Chair/bed transfer method: Ambulatory Chair/bed transfer assist level: Supervision or verbal cues Chair/bed transfer assistive device: Patent attorneyWalker     Locomotion Ambulation     Max distance: >150 ft Assist level: Supervision or verbal cues   Wheelchair     Max wheelchair distance: 200 Assist Level: Supervision or verbal cues  Cognition Comprehension Comprehension assist level: Follows complex conversation/direction with no assist  Expression Expression assist level: Expresses complex ideas: With no assist  Social Interaction Social Interaction assist level: Interacts appropriately with others - No medications needed.  Problem Solving Problem solving assist level: Solves complex problems: Recognizes & self-corrects  Memory Memory assist level: Complete Independence: No helper   Medical Problem List and Plan: 1.  Weakness, mobility deficits, difficulty completing ADLs secondary to TBI and multimedical.  -continue inpatient therapies, PT, OT, speech  -progressing towards goals 2.  DVT Prophylaxis/Anticoagulation: lovenox 3. Pain Management:  -Lyrica has helped with neuropathic pain.  -doing well with oxycodone 5-10mg  q8 prn --using rarely 4. Mood: Motivated to get better and get home. LCSW to follow for evaluation and support.  5. Neuropsych: This patient is capable of making decisions on his own behalf. 6. Skin/Wound Care: Routine pressure relief measures.  7. Fluids/Electrolytes/Nutrition:  Strict I/O. Check daily weights. 1200 cc FR/day.   -labs  with HD MWF 8. T2DM with nerupathy:   Continue lantus bid with SSI for elevated BS.    -will reduce lantus to 10u bid and observe 9. OSA: CPAP, weaning oxygen. 10. ESRD:   HD MWF later in day to help with therapy completion.   -Nephrology following 11. Gout: controlled on allopurinol.   12. Anemia of chronic disease: Monitor serially and transfuse if needed.  -continue aranesp weekly as well as iron for supplement.   -hgb around 8.0 13. HTN: normotensive 14. Morbid Obesity:               LOS (Days) 6 A FACE TO FACE EVALUATION WAS PERFORMED  [name] [name] T 03/06/2016 8:44 AM

## 2016-03-06 NOTE — Progress Notes (Signed)
RT NOTE:  Pt refuses to wear CPAP  

## 2016-03-06 NOTE — Progress Notes (Signed)
Occupational Therapy Session Note  Patient Details  Name: [name] [name] MRN: [id number] Date of Birth: [birth year]  Today's Date: 03/06/2016 OT Individual Time: 0700-0800 OT Individual Time Calculation (min): 60 min     Short Term Goals: Week 1:  OT Short Term Goal 1 (Week 1): Pt will perform shower transfer with steady assistance in order to increase I with task. OT Short Term Goal 2 (Week 1): Pt will perform LB dressing with min A in order to increase I with self care. OT Short Term Goal 3 (Week 1): Pt will demonstrate proper technique of HEP for B UE strengthening with use of paper handout . OT Short Term Goal 4 (Week 1): Pt will perform toileting with steady assistance in order to increase I with functional task.   Skilled Therapeutic Interventions/Progress Updates:    Pt engaged in BADL retraining including bathing at shower level and dressing with sit<>stand from EOB. Pt completed bathing/dressing tasks at supervision level.  Pt amb with RW into day room and engaged in simple home mgmt tasks with focus on RW safety.  Pt issued walker bag for use at home.  Pt engaged in simple home mgmt tasks including retrieving items from floor.  Pt returned to room and sat EOB awaiting his next therapy.  Focus on activity tolerance, sit<>stand, standing balance,  Therapy Documentation Precautions:  Precautions Precautions: Fall Restrictions Weight Bearing Restrictions: No Pain: Pain Assessment Pain Assessment: 0-10 Pain Score: 7  Pain Type: Chronic pain Pain Location: Back Pain Orientation: Mid;Lower Pain Descriptors / Indicators: Aching Pain Frequency: Intermittent Pain Onset: On-going Patients Stated Pain Goal: 2 Pain Intervention(s): RN aware and premedicated   See Function Navigator for Current Functional Status.   Therapy/Group: Individual Therapy  Rich BraveLanier, Joushua Dugar Chappell 03/06/2016, 9:02 AM

## 2016-03-07 ENCOUNTER — Inpatient Hospital Stay (HOSPITAL_COMMUNITY): Payer: Medicare Other | Admitting: Physical Therapy

## 2016-03-07 ENCOUNTER — Inpatient Hospital Stay (HOSPITAL_COMMUNITY): Payer: Medicare Other

## 2016-03-07 ENCOUNTER — Inpatient Hospital Stay (HOSPITAL_COMMUNITY): Payer: Medicare Other | Admitting: *Deleted

## 2016-03-07 LAB — RENAL FUNCTION PANEL
ALBUMIN: 3 g/dL — AB (ref 3.5–5.0)
Anion gap: 10 (ref 5–15)
BUN: 26 mg/dL — AB (ref 6–20)
CHLORIDE: 101 mmol/L (ref 101–111)
CO2: 25 mmol/L (ref 22–32)
CREATININE: 5.44 mg/dL — AB (ref 0.61–1.24)
Calcium: 8.8 mg/dL — ABNORMAL LOW (ref 8.9–10.3)
GFR calc Af Amer: 12 mL/min — ABNORMAL LOW (ref >60)
GFR, EST NON AFRICAN AMERICAN: 11 mL/min — AB (ref >60)
GLUCOSE: 100 mg/dL — AB (ref 65–99)
PHOSPHORUS: 3.4 mg/dL (ref 2.5–4.6)
Potassium: 3.7 mmol/L (ref 3.5–5.1)
Sodium: 136 mmol/L (ref 135–145)

## 2016-03-07 LAB — CBC
HCT: 26 % — ABNORMAL LOW (ref 39.0–52.0)
Hemoglobin: 8.1 g/dL — ABNORMAL LOW (ref 13.0–17.0)
MCH: 27.5 pg (ref 26.0–34.0)
MCHC: 31.2 g/dL (ref 30.0–36.0)
MCV: 88.1 fL (ref 78.0–100.0)
PLATELETS: 181 10*3/uL (ref 150–400)
RBC: 2.95 MIL/uL — ABNORMAL LOW (ref 4.22–5.81)
RDW: 15.9 % — AB (ref 11.5–15.5)
WBC: 6.2 10*3/uL (ref 4.0–10.5)

## 2016-03-07 LAB — GLUCOSE, CAPILLARY
GLUCOSE-CAPILLARY: 77 mg/dL (ref 65–99)
GLUCOSE-CAPILLARY: 105 mg/dL — AB (ref 65–99)
GLUCOSE-CAPILLARY: 162 mg/dL — AB (ref 65–99)

## 2016-03-07 MED ORDER — DARBEPOETIN ALFA 100 MCG/0.5ML IJ SOSY
PREFILLED_SYRINGE | INTRAMUSCULAR | Status: AC
  Filled 2016-03-07: qty 0.5

## 2016-03-07 NOTE — Procedures (Signed)
Patient seen and examined on Hemodialysis. QB 400 UF goal 2.5 No venous alarms today. Treatment adjusted as needed.  Bufford ButtnerElizabeth Rigdon Macomber MD [facility]. Cell [phone] Pager # [phone] 4:41 PM

## 2016-03-07 NOTE — Evaluation (Signed)
Recreational Therapy Assessment and Plan  Patient Details  Name: [name] [name] MRN: [id number] Date of Birth: 09/03/1960 Today's Date: 03/07/2016  Rehab Potential:  Good ELOS: d/c 11/17  Assessment Clinical Impression:Problem List:      Patient Active Problem List   Diagnosis Date Noted  . SDH (subdural hematoma) ([place]) 02/29/2016  . Traumatic subdural hemorrhage ([place]) 02/29/2016  . History of cardiac arrest   . Type 2 diabetes mellitus with peripheral neuropathy ([place])   . ESRD on dialysis ([place])   . OSA (obstructive sleep apnea)   . History of gout   . Anemia of chronic disease   . Essential hypertension   . Benign essential HTN   . Morbid obesity ([place])     Past Medical History:      Past Medical History:  Diagnosis Date  . Chronic back pain   . Chronic pain of lower extremity, bilateral   . DDD (degenerative disc disease), lumbar   . Diabetes ([place])   . ESRD on dialysis ([place])   . Gout   . Hyperlipidemia   . Neuropathy ([place])   . OA (osteoarthritis) of knee   . OSA on CPAP   . Peritonitis ([place])   . Polycystic kidney   . Uric acid kidney stone    Past Surgical History:       Past Surgical History:  Procedure Laterality Date  . PERIPHERAL VASCULAR CATHETERIZATION Right 02/15/2016   Procedure: Fistulagram;  Surgeon: Waynetta Sandy, MD;  Location: [facility];  Service: Cardiovascular;  Laterality: Right;  upper arm  . PERIPHERAL VASCULAR CATHETERIZATION Right 02/15/2016   Procedure: Peripheral Vascular Balloon Angioplasty;  Surgeon: Waynetta Sandy, MD;  Location: [facility];  Service: Cardiovascular;  Laterality: Right;  upper arm venous    Assessment & Plan Clinical Impression: Patient is a 55 yo male presented initially to [facility] and then transferred to [facility] with uncontrollable shaking, hematuria and mechanical fall with head trauma and loss of consciousness on 01/10/16. Head CT  showed acute subdural hemorrhage. While at [facility], patient had a cardiac arrest, required CPR, was intubated and was treated for aspiration pneumonia. Patient was extubaed on 02/02/16. The patient suffered rib fracutes and sternal fracture post CPR. Patient was admitted to [facility] on 02/09/16. He is on enteric precautions for C-Diff and contact precautions for MSSA. Patient is currently on a dysphagia 3 diet with thin liquids. He has 02 at 2.5 L [place]. He is receiving HD M-W-F. PT/OT evaluations were completed with ongoing treatments. Inpatient rehab was recommended by therapies.  Patient transferred to [facility] on 02/29/2016 .   Met with pt today to discuss TR services, leisure interests, activity analysis with potential modifications, community pursuits, energy conservation techniques.  Pt able to state potential activities for participation with adaptations and safety risks associated with leisure/community tasks.      Plan No further TR as pt is discharging home on Friday  Recommendations for other services: None  Discharge Criteria: Patient will be discharged from TR if patient refuses treatment 3 consecutive times without medical reason.  If treatment goals not met, if there is a change in medical status, if patient makes no progress towards goals or if patient is discharged from hospital.  The above assessment, treatment plan, treatment alternatives and goals were discussed and mutually agreed upon: by patient  [place] 03/07/2016, 4:03 PM

## 2016-03-07 NOTE — Progress Notes (Signed)
Occupational Therapy Note  Patient Details  Name: Dorothey BasemanLonnie L Goetzke MRN: [id number] Date of Birth: 12/20/1960  Today's Date: 03/07/2016 OT Individual Time: 1130-1200 OT Individual Time Calculation (min): 30 min    Pt denied pain Individual therapy  Pt engaged in functional amb with/without AD to complete simple home mgmt tasks.  Pt also engaged in dynamics standing activities on compliant and noncompliant surfaces.  Pt required steady A when engaging in tasks without AD. Continued discharge planning and pt education. Focus on activity tolerance, dynamic standing balance, functional amb with RW, discharge planning, pt education, and safety awareness to increase independence with BADLs.  [name], Reyansh Kushnir [facility]app[facility] 03/07/2016, 12:14 PM

## 2016-03-07 NOTE — Progress Notes (Signed)
Occupational Therapy Session Note  Patient Details  Name: [name] [name] MRN: [id number] Date of Birth: 1960-09-17  Today's Date: 03/07/2016 OT Individual Time: 0700-0800 OT Individual Time Calculation (min): 60 min     Short Term Goals: Week 1:  OT Short Term Goal 1 (Week 1): Pt will perform shower transfer with steady assistance in order to increase I with task. OT Short Term Goal 2 (Week 1): Pt will perform LB dressing with min A in order to increase I with self care. OT Short Term Goal 3 (Week 1): Pt will demonstrate proper technique of HEP for B UE strengthening with use of paper handout . OT Short Term Goal 4 (Week 1): Pt will perform toileting with steady assistance in order to increase I with functional task.   Skilled Therapeutic Interventions/Progress Updates:    Pt initially engaged in bathing at shower level and dressing with sit<>stand from EOB.  Pt gathered supplies prior to amb with RW into bathroom to use toilet prior to transferring to shower.  Pt completed all tasks at supervision level.  Pt amb with RW to therapy gym and engaged in dynamic standing tasks on compliant surfaces with focus on weight shifts.  Pt also practiced stepping onto 6" step with alternating LE.  Pt required steady A to complete task.  Pt amb with RW back to room and remained seated EOB with all needs within reach.  Therapy Documentation Precautions:  Precautions Precautions: Fall Restrictions Weight Bearing Restrictions: No Pain:  Pt stated he was "sore" from previous day activities; RN aware  See Function Navigator for Current Functional Status.   Therapy/Group: Individual Therapy  [name] [name], [name] [name] 03/07/2016, 9:07 AM

## 2016-03-07 NOTE — Progress Notes (Signed)
Physical Therapy Session Note  Patient Details  Name: [name] [name] MRN: [id number] Date of Birth: [birth year]  Today's Date: 03/07/2016 PT Individual Time:1000-1030   PT Individual Time Calculation: 21mn    Short Term Goals: Week 1:  PT Short Term Goal 1 (Week 1): Pt will perform bed mobility S PT Short Term Goal 2 (Week 1): Pt will perform sit <>stand with consistent minA PT Short Term Goal 3 (Week 1): Pt will perform stand pivot transfers with minA PT Short Term Goal 4 (Week 1): Pt will perform gait with RW and min guard x75' PT Short Term Goal 5 (Week 1): Pt will initiate stair training  Skilled Therapeutic Interventions/Progress Updates:     Patient received sitting EOB. Gait in room with out RW to toilet with min assist. Standing balance to wash hands at sink with supervision Assist and 1-0 UE support.   PT instructed patient in Gait in hall with RW 162fx 2 with supervision Assist and min cues for increased BOS to improve stability.   Gait in rehab gym instructed by PT without AD x [id number] [id number] min assist and cues for smaller steps when turning to prevent lateral LOB    PT instructed patient in Obstacles course including weave through 4 cones and stepping over 2. 1 inch obstacles.  without AD and min Assist. Obstacle course completed x 4 with min cues for increased step width to improve BOS and prevent incidental tandem stance and LOB.   Patient returned to room and left sitting EOB with all needs met.   Therapy Documentation Precautions:  Precautions Precautions: Fall Restrictions Weight Bearing Restrictions: No   See Function Navigator for Current Functional Status.   Therapy/Group: Individual Therapy  AustLorie Phenix15/2017, 12:59 PM

## 2016-03-07 NOTE — Progress Notes (Signed)
Physical Therapy Session Note  Patient Details  Name: [name] [name] MRN: [id number] Date of Birth: [birth year]  Today's Date: 03/07/2016 PT Individual Time: 9562-13081302-1428 PT Individual Time Calculation (min): 86 min    Short Term Goals: Week 1:  PT Short Term Goal 1 (Week 1): Pt will perform bed mobility S PT Short Term Goal 2 (Week 1): Pt will perform sit <>stand with consistent minA PT Short Term Goal 3 (Week 1): Pt will perform stand pivot transfers with minA PT Short Term Goal 4 (Week 1): Pt will perform gait with RW and min guard x75' PT Short Term Goal 5 (Week 1): Pt will initiate stair training  Skilled Therapeutic Interventions/Progress Updates:    Pt received in bed & agreeable to tx noting 7-8/10 low back/buttock ache & RN made aware. Pt ambulated throughout room to bathroom & sink without AD & min guard assist for balance training; pt with continent void. Educated pt on stair negotiation & RW placement at bottom/top of steps. Pt completed stair negotiation with HHA with pt requiring mod assist fading to min assist; therapist educated pt on compensatory technique and pt able to recall & return demonstrate. Pt with improving balance during task. Educated pt on home modifications to reduce tripping hazards & what to do if experiencing a fall at home; pt able to verbally recall 4/4 without assistance. Pt able to complete floor transfer with supervision by pushing on couch to return to sitting. Pt completed Berg Balance Test & scored [id number]; educated pt on interpretation of score, high fall risk, and need to utilize RW when ambulating. Patient demonstrates increased fall risk as noted by score of  34/56 on Berg Balance Scale.  (<36= high risk for falls, close to 100%; 37-45 significant >80%; 46-51 moderate >50%; 52-55 lower >25%) At end of session pt left sitting on EOB with all needs within reach.    Therapy Documentation Precautions:  Precautions Precautions: Fall Restrictions Weight  Bearing Restrictions: No  Balance: Balance Balance Assessed: Yes Standardized Balance Assessment Standardized Balance Assessment: Berg Balance Test Berg Balance Test Sit to Stand: Able to stand without using hands and stabilize independently Standing Unsupported: Able to stand 2 minutes with supervision Sitting with Back Unsupported but Feet Supported on Floor or Stool: Able to sit safely and securely 2 minutes Stand to Sit: Uses backs of legs against chair to control descent Transfers: Able to transfer with verbal cueing and /or supervision (minimal use of hands but supervision required) Standing Unsupported with Eyes Closed: Able to stand 10 seconds with supervision Standing Ubsupported with Feet Together: Able to place feet together independently and stand for 1 minute with supervision From Standing, Reach Forward with Outstretched Arm: Can reach forward >12 cm safely (5") From Standing Position, Pick up Object from Floor: Able to pick up shoe, needs supervision From Standing Position, Turn to Look Behind Over each Shoulder: Turn sideways only but maintains balance Turn 360 Degrees: Needs close supervision or verbal cueing (completes turns to both sides in 8 seconds each but requires supervision for task) Standing Unsupported, Alternately Place Feet on Step/Stool: Needs assistance to keep from falling or unable to try Standing Unsupported, One Foot in Front: Able to plae foot ahead of the other independently and hold 30 seconds Standing on One Leg: Tries to lift leg/unable to hold 3 seconds but remains standing independently Total Score: 34   See Function Navigator for Current Functional Status.   Therapy/Group: Individual Therapy  Sandi MariscalVictoria M Calista Crain 03/07/2016, 2:50 PM

## 2016-03-07 NOTE — Progress Notes (Signed)
**[name] [name]** [name] [name]  Subjective/Interval History:   Feels well.  D/c planned for Friday.  Will need to remain on HD until he can see Dr. Laural [name] in clinic to make the transition back to PD.  Objective Vital signs in last 24 hours: Vitals:   03/07/16 1540 03/07/16 1545 03/07/16 1600 03/07/16 1630  BP: 112/66 111/60 (!) 102/52 (!) 103/58  Pulse: 64 70 71 69  Resp:      Temp:      TempSrc:      SpO2:      Weight:      Height:       Weight change: -1.9 kg (-4 lb 3 oz)  Intake/Output Summary (Last 24 hours) at 03/07/16 1638 Last data filed at 03/07/16 1300  Gross per 24 hour  Intake              600 ml  Output              200 ml  Net              400 ml   Physical Exam:  Blood pressure (!) 103/58, pulse 69, temperature 98.1 F (36.7 C), temperature source Oral, resp. rate 16, height 5\' 9"  (1.753 m), weight 107.2 kg (236 lb 5.3 oz), SpO2 92 %.  GEN: well appearing  HEENT: EOMI, PERRL, no JVD Cv: RRR LUNGS: clear ABD: PD cath in place RLQ with dressing, clean/dry/intact.  No tenderness. EXT: 1+ LE edema, L > R ACCESS: RUE AVF good bruit and thrill  Labs:  Recent Labs Lab 03/02/16 1540 03/05/16 1505 03/06/16 1628  NA 139 139 139  K 4.7 4.4 4.3  CL 102 104 102  CO2 28 24 28   GLUCOSE 107* 107* 156*  BUN 40* 43* 32*  CREATININE 7.35* 7.96* 6.85*  CALCIUM 10.3 9.6 9.9  PHOS 4.0 4.4 5.1*     Recent Labs Lab 03/02/16 1540 03/05/16 1505 03/06/16 1628  ALBUMIN 2.5* 2.8* 2.8*     Recent Labs Lab 03/02/16 1541 03/05/16 1505 03/06/16 1628  WBC 7.2 5.1 5.3  HGB 7.9* 8.0* 7.9*  HCT 25.7* 25.4* 25.7*  MCV 88.9 88.5 89.9  PLT 194 164 162     Recent Labs Lab 03/06/16 1202 03/06/16 1615 03/06/16 2101 03/07/16 0645 03/07/16 1131  GLUCAP 76 149* 88 77 105*     Medications:  . allopurinol  100 mg Oral Daily  . atorvastatin  40 mg Oral q1800  . carvedilol  18.75 mg Oral BID WC  . cholecalciferol  1,000 Units Oral Daily  . cinacalcet  60 mg Oral Q  supper  . darbepoetin (ARANESP) injection - DIALYSIS  100 mcg Intravenous Q Wed-HD  . famotidine  20 mg Oral Daily  . ferric gluconate (FERRLECIT/NULECIT) IV  62.5 mg Intravenous Q Wed-HD  . gentamicin ointment   Topical TID  . heparin  5,000 Units Subcutaneous Q8H  . insulin aspart  0-5 Units Subcutaneous QHS  . insulin aspart  0-9 Units Subcutaneous TID WC  . insulin glargine  10 Units Subcutaneous BID  . lidocaine  2 patch Transdermal Q24H  . modafinil  100 mg Oral Daily  . polyethylene glycol  17 g Oral BID  . pregabalin  75 mg Oral Daily  . sevelamer carbonate  1,600 mg Oral TID WC  . 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 Alden ServerErnest  [name] - Admitted 9/29 [name] [name] [facility] with head trauma 9/19 found to have SDH and during admission had cardiac arrest 2/2 aspiration, MSSA PNA, CDiff.  Prev on PD (still has catheter), but currently getting HD via his backup AVF (last intervention 02/15/16 thrombolysis) Was prev at [facility] now in CIR for rehab.    Assessment/Recommendations  1. ESRD 1. PD as outpt up until admitted 2. Transitioned to HD TIW during admit using RUE AVF  3. Outpt nephrology is Dr. Kayren EavesErnest [name] -- [facility] 4. Cont MWF HD Sessions via RUE AVF in PM: 2K, 2 Ca, No heparin, Qb 400.   EDW approx 102.5. For HD 11/15. 5. Arranged weekly PD catheter flushing with 6E nursing - done 11/11, only 400cc fluid returned so will need to keep an eye on that. - getting dressing changes.  Will arrange one more flush before d/c.   6. Pt wishes to return to PD when home for CIR- will need to see Dr. Laural [name] in clinic before transitioning back.  Wife not trained in PD to help him per pt.  [facility].   7. 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 Stop phoslo, increase sevelemer,  continue sensipar. Change to 2Ca bath with HD 4. Anemia of CKD and Chronic Illness -- on Darbe 100 QWed; last TSAT 22% and ferritin 599 (02/10/16). Add weekly iron dose. 5. HTN, stable 6. DM2   [name] ButtnerElizabeth Vania Rosero, MD [facility] pgr [phone] Cell [phone] 03/07/2016, 4:38 PM

## 2016-03-07 NOTE — Progress Notes (Signed)
Called [facility] nephrology and talked with office as well as person in charge of dialysis. She relayed that as patient has been on HD, he will need to follow up in office for evaluation to see if he is able to resume PD. She also expressed concerns about his ability to do PD as he will have to lift and hang 5 L bags of dialysate.  Relayed that patient was very anxious about HD and wanted to return to PD. Records faxed to office for evaluation by Dr. Laural BenesJohnson and they will get back to us. Discussed with Dr. Signe ColtUpton who reports that above is consistent with their office protocol also. Advised patient to have wife call and talk with office to relay amount of assitance that she could offer. .Marland Kitchen

## 2016-03-07 NOTE — Progress Notes (Addendum)
[place] PHYSICAL MEDICINE & REHABILITATION     PROGRESS NOTE    Subjective/Complaints: In very good spirits. Happy with progress. Walked without a device yesterday.  ROS: Pt denies fever, rash/itching, headache, blurred or double vision, nausea, vomiting, abdominal pain, diarrhea, chest pain, shortness of breath, palpitations, dysuria, dizziness, neck pain, back pain, bleeding, anxiety, or depression     Objective: Vital Signs: Blood pressure (!) 143/61, pulse 79, temperature 98.6 F (37 C), temperature source Oral, resp. rate 18, height 5\' 9"  (1.753 m), weight 106.1 kg (233 lb 14.5 oz), SpO2 98 %. No results found.  Recent Labs  03/05/16 1505 03/06/16 1628  WBC 5.1 5.3  HGB 8.0* 7.9*  HCT 25.4* 25.7*  PLT 164 162    Recent Labs  03/05/16 1505 03/06/16 1628  NA 139 139  K 4.4 4.3  CL 104 102  GLUCOSE 107* 156*  BUN 43* 32*  CREATININE 7.96* 6.85*  CALCIUM 9.6 9.9   CBG (last 3)   Recent Labs  03/06/16 1615 03/06/16 2101 03/07/16 0645  GLUCAP 149* 88 77    Wt Readings from Last 3 Encounters:  03/07/16 106.1 kg (233 lb 14.5 oz)  02/28/16 115.2 kg (254 lb)    Physical Exam:  Constitutional: NAD.   HENT:  Head: Normocephalic and atraumatic.  Mouth/Throat: Oropharynx moist Eyes: PERRL Neck: Normal range of motion. supple.  Cardiovascular: Normal rate and regular rhythm. No JVD  Murmur present syst Respiratory: CTA.    GI: Soft. Bowel sounds are normal. He exhibits no distension. There is tenderness ?RLQ --near Peritoneal dialysis catheter in place with dry dressing intact Musculoskeletal: He exhibits no edema or tenderness. - edema, Left lower extremity without tenderness, no erythema, no swelling. Bilateral feet. Without edema or skin breakdown.  Neurological: He is alert and oriented to person, place, and time. No cranial nerve deficit. appropriate Motor: 4+/5 grossly throughout   prox to distal. No sensory findings Skin: Skin is warm and dry.  AVG RUE intact with thrill Psychiatric:pleasant and cooperative  Assessment/Plan: 1. Functional deficits secondary to TBI which require 3+ hours per day of interdisciplinary therapy in a comprehensive inpatient rehab setting. Physiatrist is providing close team supervision and 24 hour management of active medical problems listed below. Physiatrist and rehab team continue to assess barriers to discharge/monitor patient progress toward functional and medical goals.  Function:  Bathing Bathing position   Position: Shower  Bathing parts Body parts bathed by patient: Right arm, Left arm, Chest, Abdomen, Front perineal area, Buttocks, Right upper leg, Left upper leg, Right lower leg, Left lower leg, Back Body parts bathed by helper: Right lower leg, Left lower leg, Back  Bathing assist Assist Level: Supervision or verbal cues      Upper Body Dressing/Undressing Upper body dressing   What is the patient wearing?: Pull over shirt/dress     Pull over shirt/dress - Perfomed by patient: Thread/unthread right sleeve, Thread/unthread left sleeve, Put head through opening, Pull shirt over trunk          Upper body assist Assist Level: Supervision or verbal cues      Lower Body Dressing/Undressing Lower body dressing   What is the patient wearing?: Pants, Non-skid slipper socks, Shoes     Pants- Performed by patient: Thread/unthread right pants leg, Thread/unthread left pants leg, Pull pants up/down Pants- Performed by helper: Thread/unthread left pants leg Non-skid slipper socks- Performed by patient: Don/doff right sock, Don/doff left sock Non-skid slipper socks- Performed by helper: Don/doff right sock,  Don/doff left sock     Shoes - Performed by patient: Don/doff right shoe, Don/doff left shoe, Fasten right, Fasten left            Lower body assist Assist for lower body dressing: Supervision or verbal cues      Toileting Toileting   Toileting steps completed by patient:  Adjust clothing prior to toileting, Performs perineal hygiene, Adjust clothing after toileting Toileting steps completed by helper: Adjust clothing prior to toileting, Performs perineal hygiene, Adjust clothing after toileting Toileting Assistive Devices: Grab bar or rail  Toileting assist Assist level: Supervision or verbal cues   Transfers Chair/bed transfer   Chair/bed transfer method: Ambulatory Chair/bed transfer assist level: Supervision or verbal cues Chair/bed transfer assistive device: Patent attorneyWalker     Locomotion Ambulation     Max distance: 150 ft Assist level: Supervision or verbal cues   Wheelchair     Max wheelchair distance: 200 Assist Level: Supervision or verbal cues  Cognition Comprehension Comprehension assist level: Follows complex conversation/direction with no assist  Expression Expression assist level: Expresses complex ideas: With no assist  Social Interaction Social Interaction assist level: Interacts appropriately with others - No medications needed.  Problem Solving Problem solving assist level: Solves complex problems: Recognizes & self-corrects  Memory Memory assist level: Complete Independence: No helper   Medical Problem List and Plan: 1.  Weakness, mobility deficits, difficulty completing ADLs secondary to TBI and multimedical.  -continue inpatient therapies, PT, OT, speech  -progressing towards goals 2.  DVT Prophylaxis/Anticoagulation: lovenox 3. Pain Management:  -continue lyrica for neuropathic pain  -doing well with oxycodone 5-10mg  q8 prn --using rarely 4. Mood: Motivated to get better and get home. LCSW to follow for evaluation and support.  5. Neuropsych: This patient is capable of making decisions on his own behalf. 6. Skin/Wound Care: Routine pressure relief measures.  7. Fluids/Electrolytes/Nutrition:  Strict I/O. Check daily weights. 1200 cc FR/day.   -labs  with HD MWF 8. T2DM with nerupathy:   Continue lantus bid with SSI for elevated  BS.    -will reduce lantus to 10u bid and observe 9. OSA: CPAP, weaning oxygen. 10. ESRD:  HD MWF later in day to help with therapy completion.   -Nephrology following  -convert back to PD upon discharge due to issues with transportation, pt preference etc.  11. Gout: controlled on allopurinol.   12. Anemia of chronic disease: Monitor serially and transfuse if needed.  -continue aranesp weekly as well as iron for supplement.   -hgb around 8.0 13. HTN: normotensive 14. Morbid Obesity:               LOS (Days) 7 A FACE TO FACE EVALUATION WAS PERFORMED  SWARTZ,ZACHARY T 03/07/2016 8:45 AM

## 2016-03-07 NOTE — Progress Notes (Signed)
Prior to HD this RN changed dressing and applied Gentimycin to PD catheter exit site. Patient also requested to use of bathroom before going to HD.

## 2016-03-08 ENCOUNTER — Inpatient Hospital Stay (HOSPITAL_COMMUNITY): Payer: Medicare Other | Admitting: Physical Therapy

## 2016-03-08 ENCOUNTER — Inpatient Hospital Stay (HOSPITAL_COMMUNITY): Payer: Medicare Other | Admitting: Occupational Therapy

## 2016-03-08 ENCOUNTER — Inpatient Hospital Stay (HOSPITAL_COMMUNITY): Payer: Medicare Other

## 2016-03-08 LAB — GLUCOSE, CAPILLARY
GLUCOSE-CAPILLARY: 101 mg/dL — AB (ref 65–99)
GLUCOSE-CAPILLARY: 159 mg/dL — AB (ref 65–99)
GLUCOSE-CAPILLARY: 86 mg/dL (ref 65–99)
Glucose-Capillary: 197 mg/dL — ABNORMAL HIGH (ref 65–99)

## 2016-03-08 MED ORDER — OXYCODONE HCL 5 MG PO TABS
5.0000 mg | ORAL_TABLET | Freq: Three times a day (TID) | ORAL | Status: DC | PRN
  Administered 2016-03-09: 5 mg via ORAL

## 2016-03-08 MED ORDER — POLYETHYLENE GLYCOL 3350 17 G PO PACK: 17.0000 g | PACK | Freq: Two times a day (BID) | ORAL | 0 refills | Status: AC

## 2016-03-08 MED ORDER — GENTAMICIN SULFATE 0.1 % EX OINT: TOPICAL_OINTMENT | Freq: Three times a day (TID) | CUTANEOUS | 2 refills | Status: DC

## 2016-03-08 MED ORDER — CINACALCET HCL 30 MG PO TABS: 60.0000 mg | ORAL_TABLET | Freq: Every day | ORAL | 0 refills | Status: AC

## 2016-03-08 MED ORDER — INSULIN GLARGINE 100 UNIT/ML ~~LOC~~ SOLN: 10.0000 [IU] | Freq: Two times a day (BID) | SUBCUTANEOUS | 11 refills | Status: AC

## 2016-03-08 MED ORDER — OXYCODONE HCL 5 MG PO TABS: 5.0000 mg | ORAL_TABLET | Freq: Every day | ORAL | 0 refills | Status: AC | PRN

## 2016-03-08 MED ORDER — FAMOTIDINE 20 MG PO TABS: 20.0000 mg | ORAL_TABLET | Freq: Every day | ORAL | 0 refills | Status: AC

## 2016-03-08 MED ORDER — SEVELAMER CARBONATE 800 MG PO TABS: 1600.0000 mg | ORAL_TABLET | Freq: Three times a day (TID) | ORAL | 0 refills | Status: AC

## 2016-03-08 MED ORDER — CARVEDILOL 12.5 MG PO TABS: 18.7500 mg | ORAL_TABLET | Freq: Two times a day (BID) | ORAL | 0 refills | Status: AC

## 2016-03-08 MED ORDER — TAMSULOSIN HCL 0.4 MG PO CAPS: 0.4000 mg | ORAL_CAPSULE | Freq: Every day | ORAL | 0 refills | Status: AC

## 2016-03-08 MED ORDER — MODAFINIL 100 MG PO TABS: 100.0000 mg | ORAL_TABLET | Freq: Every day | ORAL | 0 refills | Status: DC

## 2016-03-08 MED ORDER — PREGABALIN 75 MG PO CAPS: 75.0000 mg | ORAL_CAPSULE | Freq: Every day | ORAL | 0 refills | Status: AC

## 2016-03-08 NOTE — Progress Notes (Signed)
Physical Therapy Discharge Summary  Patient Details  Name: [name] [name] MRN: [id number] Date of Birth: [birth year]  Today's Date: 03/08/2016 PT Individual Time: 1600-1700 PT Individual Time Calculation: 60 min        Patient has met 10 of 10 long term goals due to improved activity tolerance, improved balance, improved postural control, increased strength, increased range of motion, decreased pain and improved attention.  Patient to discharge at an ambulatory level Modified Independent.   Patient's care partner is independent to provide the necessary physical assistance at discharge.  Recommendation:  Patient will benefit from ongoing skilled PT services in home health setting to continue to advance safe functional mobility, address ongoing impairments in balance, strength, endurance, and minimize fall risk.  Equipment: Rolling walker  Reasons for discharge: treatment goals met and discharge from hospital  Patient/family agrees with progress made and goals achieved: Yes   PT Treatment:  PT instructed patient in Grad day assessment; see results Below. PT educated patient and spouse in safe entrance into house with 1 rail support with supervision Assist and on rail support with min assist x 4 each. PT educated patient's spouse in car transfer with distant supervision as well as gait training in community environment and over uneven surface. Following treatment, patient returned to room and left sitting EOB with call bell in reach and all needs met.   PT Discharge Precautions/RestrictionsRestrictions Weight Bearing Restrictions: No Vital Signs Therapy Vitals Temp: 98.6 F (37 C) Temp Source: Oral Pulse Rate: 70 Resp: 16 BP: 130/63 Patient Position (if appropriate): Lying Oxygen Therapy SpO2: 99 % O2 Device: Not Delivered Pain Pain Assessment Pain Assessment: No/denies pain Pain Score: 0-No pain Vision/Perception    WFL Cognition Overall Cognitive Status: Within  Functional Limits for tasks assessed Orientation Level: Oriented X4 Memory: Appears intact Awareness: Appears intact Problem Solving: Appears intact Safety/Judgment: Appears intact Sensation Sensation Light Touch: Appears Intact Stereognosis: Appears Intact Hot/Cold: Appears Intact Proprioception: Appears Intact Coordination Gross Motor Movements are Fluid and Coordinated: Yes Fine Motor Movements are Fluid and Coordinated: Yes Motor  Motor Motor: Other (comment) Motor - Skilled Clinical Observations: generalized weakness  Mobility Bed Mobility Bed Mobility: Rolling Right;Rolling Left;Sit to Supine;Supine to Sit Rolling Right: 6: Modified independent (Device/Increase time) Rolling Left: 6: Modified independent (Device/Increase time) Supine to Sit: 6: Modified independent (Device/Increase time) Sit to Supine: 6: Modified independent (Device/Increase time) Transfers Transfers: Yes Sit to Stand: 6: Modified independent (Device/Increase time) Stand to Sit: 6: Modified independent (Device/Increase time) Stand Pivot Transfers: 6: Modified independent (Device/Increase time) (with RW) Locomotion  Ambulation Ambulation: Yes Ambulation/Gait Assistance: 6: Modified independent (Device/Increase time) Ambulation Distance (Feet): 400 Feet Assistive device: Rolling walker Gait Gait: Yes Gait Pattern: Impaired Gait Pattern: Narrow base of support Stairs / Additional Locomotion Stairs: Yes Stairs Assistance: 5: Supervision Stairs Assistance Details: Verbal cues for precautions/safety;Verbal cues for safe use of DME/AE Stair Management Technique: One rail Left Number of Stairs: 12 Height of Stairs: 6 Ramp: 6: Modified independent (Device) Curb: 6: Modified independent (Device/increase time) Wheelchair Mobility Wheelchair Mobility: No  Trunk/Postural Assessment  Cervical Assessment Cervical Assessment: Within Functional Limits Thoracic Assessment Thoracic Assessment: Within  Functional Limits Lumbar Assessment Lumbar Assessment: Exceptions to [facility] (reduced lordosis) Postural Control Postural Control: Within Functional Limits  Balance Dynamic Sitting Balance Dynamic Sitting - Level of Assistance: 6: Modified independent (Device/Increase time) Static Standing Balance Static Standing - Balance Support: No upper extremity supported Static Standing - Level of Assistance: 6: Modified independent (Device/Increase time) Dynamic Standing  Balance Dynamic Standing - Balance Support: Bilateral upper extremity supported Dynamic Standing - Level of Assistance: 6: Modified independent (Device/Increase time) Dynamic Standing - Comments: functional mobiliity Extremity Assessment      RLE Assessment RLE Assessment: Within Functional Limits RLE Strength Right Hip Flexion: 4+/5 Right Knee Flexion: 4+/5 Right Knee Extension: 4+/5 Right Ankle Dorsiflexion: 4+/5 Right Ankle Plantar Flexion: 4+/5 LLE Assessment LLE Assessment: Within Functional Limits LLE Strength Left Hip Flexion: 4+/5 Left Knee Flexion: 4+/5 Left Knee Extension: 5/5 Left Ankle Dorsiflexion: 4+/5 Left Ankle Plantar Flexion: 4+/5   See Function Navigator for Current Functional Status.  [name] [name] 03/08/2016, 4:48 PM

## 2016-03-08 NOTE — Progress Notes (Addendum)
Caroline[facility] called back to relay that Dr. Laural BenesJohnson recommends HD till albumin levels and energy levels improve. They will try to get him in on Sunday (due to thanksgiving next week) but need labs, CXR and orders faxed [phone]) to help facilitate transition.  Nephrology contacted and will fax records after dialysis tomorrow.

## 2016-03-08 NOTE — Progress Notes (Signed)
Occupational Therapy Session Note  Patient Details  Name: [name] BasemanLonnie L Rudin MRN: [id number] Date of Birth: [birth year]  Today's Date: 03/08/2016 OT Individual Time: 0700-0800 OT Individual Time Calculation (min): 60 min     Short Term Goals: Week 1:  OT Short Term Goal 1 (Week 1): Pt will perform shower transfer with steady assistance in order to increase I with task. OT Short Term Goal 2 (Week 1): Pt will perform LB dressing with min A in order to increase I with self care. OT Short Term Goal 3 (Week 1): Pt will demonstrate proper technique of HEP for B UE strengthening with use of paper handout . OT Short Term Goal 4 (Week 1): Pt will perform toileting with steady assistance in order to increase I with functional task.   Skilled Therapeutic Interventions/Progress Updates:    Pt initially engaged in bathing at shower level and dressing with sit<>stand from EOB. Pt completed bathing/dressing tasks at mod I level and at supervision level for shower transfer.  Recommend supervision for shower transfer at home.  Pt amb with RW to therapy gym and engaged in dynamic standing tasks before returning to room to await his next therapy.  Focus on activity tolerance, sit<>stand, standing balance, functional amb with RW, functional transfers, home safety, and continued discharge planning.  Pt please with progress and ready for discharge tomorrow after HD.  Therapy Documentation Precautions:  Precautions Precautions: Fall Restrictions Weight Bearing Restrictions: No Pain:  Pt c/o "stiffness" in knees; increase activity   See Function Navigator for Current Functional Status.   Therapy/Group: Individual Therapy  Rich BraveLanier, Laryssa Hassing Chappell 03/08/2016, 8:04 AM

## 2016-03-08 NOTE — Progress Notes (Signed)
Social Work Patient ID: [name] [name], male   DOB: [birth year], 55 y.o.   MRN: [id number]   Met with pt and spoke with wife via phone following conference.  Both aware and agreeable with targeted d/c date of 11/17 and mod independent goals.  Both aware we will request he have early HD on Friday prior to his d/c.  Appears plan at this point is for pt to continue with HD at d/c until he follows up with MD at clinic to make PD transition.  Wife to be in this afternoon for education with PT.  [name] Pall, LCSW

## 2016-03-08 NOTE — Patient Care Conference (Signed)
Inpatient RehabilitationTeam Conference and Plan of Care Update Date: 03/06/2016   Time: 2:45 PM    Patient Name: [name] [name]      Medical Record Number: [id number]  Date of Birth: 1960/08/12 Sex: Male         Room/Bed: 4W11C/4W11C-01 Payor Info: Payor: MEDICARE / Plan: MEDICARE PART A AND B / Product Type: *No Product type* /    Admitting Diagnosis: TBI Post Arrest VDRF  Admit Date/Time:  02/29/2016  4:52 PM Admission Comments: No comment available   Primary Diagnosis:  Traumatic subdural hematoma with loss of consciousness of 31 minutes to 59 minutes (HCC) Principal Problem: Traumatic subdural hematoma with loss of consciousness of 31 minutes to 59 minutes [facility](HCC)  Patient Active Problem List   Diagnosis Date Noted  . Traumatic subdural hematoma with loss of consciousness of 31 minutes to 59 minutes (HCC) 02/29/2016  . History of cardiac arrest   . Type 2 diabetes mellitus with peripheral neuropathy (HCC)   . ESRD on dialysis (HCC)   . OSA (obstructive sleep apnea)   . History of gout   . Anemia of chronic disease   . Essential hypertension   . Benign essential HTN   . Morbid obesity [facility](HCC)     Expected Discharge Date: Expected Discharge Date: 03/09/16  Team Members Present: Physician leading conference: Dr. Faith RogueZachary Swartz Social Worker Present: Amada JupiterLucy Lili Harts, LCSW Nurse Present: Ronny BaconWhitney Reardon, RN PT Present: Katherine Mantleodney Wishart, PT OT Present: Ardis Rowanom Lanier, COTA;Jennifer Katrinka BlazingSmith, OT SLP Present: Feliberto Gottronourtney Payne, SLP PPS Coordinator present : Tora DuckMarie Noel, RN, CRRN     Current Status/Progress Goal Weekly Team Focus  Medical   improved pain control, still slightly impulsive. emptying bowel and bladder  improve balance and functional mobility  nutition, bp control   Bowel/Bladder   Continent of bowel/bladder. LBM 03/04/16. positive for C. diff  Patient to remain continent of bowel/bladder with min assist  monitor bowel/bladder function q shift and as needed   Swallow/Nutrition/  Hydration             ADL's   supervision overall, fatigues easily  mod I/supervision overall  family education, activity tolerance, dynamic standing balance, discharge planning   Mobility   supervision for ambulation with RW & transfers, mod assist for stair negotiation without rails  mod I overall  stair negotiation without rails, endurance training, strengthening, gait, high level balance   Communication             Safety/Cognition/ Behavioral Observations            Pain   no complain of pain  <3  assess and treat pain q shift and as needed   Skin   PD cath on ABD, Fistula R arm  skin [facility]  monitor skin q shift and as needed    Rehab Goals Patient on target to meet rehab goals: Yes *See Care Plan and progress notes for long and short-term goals.  Barriers to Discharge: safety awareness, balance    Possible Resolutions to Barriers:  ongoing NMR, education    Discharge Planning/Teaching Needs:  Home with wife who can provide intermittent support.  Only PT needs ed time   Team Discussion:  Still with some balance issues but on track to meet mod ind goals.  Discussed follow up and DME needs.  No concerns.  Revisions to Treatment Plan:  None   Continued Need for Acute Rehabilitation Level of Care: The patient requires daily medical  management by a physician with specialized training in physical medicine and rehabilitation for the following conditions: Daily direction of a multidisciplinary physical rehabilitation program to ensure safe treatment while eliciting the highest outcome that is of practical value to the patient.: Yes Daily medical management of patient stability for increased activity during participation in an intensive rehabilitation regime.: Yes Daily analysis of laboratory values and/or radiology reports with any subsequent need for medication adjustment of medical intervention for : Neurological problems;Post surgical  problems  [name] [name] 03/08/2016, 11:23 AM

## 2016-03-08 NOTE — Progress Notes (Signed)
Occupational Therapy Session Note  Patient Details  Name: [name] [name] MRN: [id number] Date of Birth: 27-Jun-1960  Today's Date: 03/08/2016 OT Individual Time: 5809-9833 OT Individual Time Calculation (min): 25 min     Short Term Goals: Week 1:  OT Short Term Goal 1 (Week 1): Pt will perform shower transfer with steady assistance in order to increase I with task. OT Short Term Goal 2 (Week 1): Pt will perform LB dressing with min A in order to increase I with self care. OT Short Term Goal 3 (Week 1): Pt will demonstrate proper technique of HEP for B UE strengthening with use of paper handout . OT Short Term Goal 4 (Week 1): Pt will perform toileting with steady assistance in order to increase I with functional task.       Skilled Therapeutic Interventions/Progress Updates:    Pt seen this session to address activity tolerance and dynamic balance skills.  Pt agreeable to therapy and ambulated to gym with RW with S.  Pt worked on standing balance exercises he could do at home at his kitchen sink.  Completed 1 set of heel raises 12x and 1 set 12x of hip abduction then adduction in standing, then on other leg to work on hip stabilty. Pt did become slightly short of breath so sat on mat to rest briefly.  Discussed how to pace and progress the exercises at home with his wife with him for safety.   After a short rest, ambulated back to room and got into bed to rest. Pt in room with all needs met.    Therapy Documentation Precautions:  Precautions Precautions: Fall Restrictions Weight Bearing Restrictions: No   Pain: no c/o pain   ADL:   See Function Navigator for Current Functional Status.   Therapy/Group: Individual Therapy  Yvonda Fouty 03/08/2016, 12:42 PM

## 2016-03-08 NOTE — Progress Notes (Signed)
Occupational Therapy Note  Patient Details  Name: Dorothey BasemanLonnie L Imes MRN: [id number] Date of Birth: 1961-01-04  Today's Date: 03/08/2016 OT Individual Time: 1100-1200 OT Individual Time Calculation (min): 60 min    Pt denied pain Individual Therapy  Pt resting in bed upon arrival.  Pt stood EOB and amb with RW to sink to wash hands.  Pt's hands began shaking and was unable to respond when spoken to.  Pt assisted to sitting EOB and pt became more alert.  BP 102/59, HR 79. Attempted to take BP standing but pt became symptomatic again before BP reading completed.  RN notified. CBG=159.  PA notified and attended to pt.  PA suspected orthostasis.  Educated pt on controlling transitional movements to allow BP to stabilize.  Pt verbalized understanding.  Pt amb with RW to day room and engaged in bowling activity with focus on dynamic standing balance.  Pt stated he was "pretty tired" and returned to room at w/c level.  Pt returned to bed with all needs within reach.   [name], Dannah Ryles [facility] 03/08/2016, 12:12 PM

## 2016-03-08 NOTE — Progress Notes (Signed)
Called to the room by OT re pt was shaking and  Staring off into space. [name] [name] had a hard time getting him to follow commands for a brief period. Took to the room and sat on edge of bed. VS taken, see flowsheet. Had pt stand up again to check orthostasis and shaking started again. BP 102/59 p 79 and o2 sat 98%. Checked  CBG= 155. After a brief period, pt reported he was better and returned to therapy.Pam Love PAC notified of event and vitals, initially noted to return to bed however after visual assessment, pt continued with therapy with the understanding to report change in status or light headedness, etc. tol therapy session, smiling and conversing without difficulty. Pamelia HoitSharp, Danaysha Kirn B

## 2016-03-08 NOTE — Progress Notes (Signed)
Occupational Therapy Discharge Summary  Patient Details  Name: [name] [name] MRN: [id number] Date of Birth: 03-12-1961   Patient has met 10 of 10 long term goals due to improved activity tolerance, improved balance and postural control.  Pt made steady progress with BADLs during this admission and completes bathing, dressing, toilet transfers, and toileting tasks at mod I level.  Recommended that pt have supervision during shower transfers.  Pt educated on safe strategies for performing laundry tasks and pt has demonstrated proficiency with laundry tasks.  Pt's wife has not been present for OT sessions or family education. Patient to discharge at overall Modified Independent level.  Patient's care partner is independent  (with her spouse directing care) to provide the necessary physical assistance at discharge.     Recommendation:  Patient will benefit from ongoing skilled OT services in home health setting to continue to advance functional skills in the area of iADL.  Equipment: BSC, tub transfer bench  Reasons for discharge: treatment goals met  Patient/family agrees with progress made and goals achieved: Yes  OT Discharge   Vision/Perception  Vision- History Baseline Vision/History: Wears glasses Wears Glasses: Reading only Patient Visual Report: No change from baseline Vision- Assessment Vision Assessment?: No apparent visual deficits  Cognition Overall Cognitive Status: Within Functional Limits for tasks assessed Orientation Level: Oriented X4 Memory: Appears intact Awareness: Appears intact Problem Solving: Appears intact Safety/Judgment: Appears intact Sensation Sensation Light Touch: Appears Intact Stereognosis: Appears Intact Hot/Cold: Appears Intact Proprioception: Appears Intact Coordination Gross Motor Movements are Fluid and Coordinated: Yes Fine Motor Movements are Fluid and Coordinated: Yes Motor  Motor Motor - Skilled Clinical Observations:  generalized weakness    Trunk/Postural Assessment  Cervical Assessment Cervical Assessment: Within Functional Limits Thoracic Assessment Thoracic Assessment: Within Functional Limits Lumbar Assessment Lumbar Assessment: Exceptions to [facility] (reduced lordosis) Postural Control Postural Control: Within Functional Limits  Balance Dynamic Sitting Balance Dynamic Sitting - Balance Support: Feet supported;During functional activity Dynamic Sitting - Level of Assistance: 7: Independent Extremity/Trunk Assessment RUE Assessment RUE Assessment: Within Functional Limits LUE Assessment LUE Assessment: Within Functional Limits   See Function Navigator for Current Functional Status.  [name] [facility] 03/08/2016, 12:24 PM

## 2016-03-08 NOTE — Progress Notes (Signed)
[facility] Rounding Note  Subjective/Interval History:   Feels well.  D/c planned for Friday.  Will need to remain on HD until he can see Dr. Laural BenesJohnson in clinic to make the transition back to Pd.  For HD tomorrow  Objective Vital signs in last 24 hours: Vitals:   03/07/16 1815 03/07/16 1842 03/07/16 2123 03/08/16 0627  BP: (!) 103/53 (!) 116/57 (!) 104/54 111/78  Pulse: 69 67 76 74  Resp:  16  17  Temp:  98 F (36.7 C)  98.7 F (37.1 C)  TempSrc:  Oral  Oral  SpO2:  94%  94%  Weight:  104.3 kg (229 lb 15 oz)    Height:       Weight change: 1.1 kg (2 lb 6.8 oz)  Intake/Output Summary (Last 24 hours) at 03/08/16 1459 Last data filed at 03/08/16 1300  Gross per 24 hour  Intake              480 ml  Output             2387 ml  Net            -1907 ml   Physical Exam:  Blood pressure 111/78, pulse 74, temperature 98.7 F (37.1 C), temperature source Oral, resp. rate 17, height 5\' 9"  (1.753 m), weight 104.3 kg (229 lb 15 oz), SpO2 94 %.  GEN: well appearing  HEENT: EOMI, PERRL, no JVD Cv: RRR LUNGS: clear ABD: PD cath in place RLQ with dressing, clean/dry/intact.  No tenderness. EXT: 1+ LE edema, L > R ACCESS: RUE AVF good bruit and thrill  Labs:  Recent Labs Lab 03/02/16 1540 03/05/16 1505 03/06/16 1628 03/07/16 1642  NA 139 139 139 136  K 4.7 4.4 4.3 3.7  CL 102 104 102 101  CO2 28 24 28 25   GLUCOSE 107* 107* 156* 100*  BUN 40* 43* 32* 26*  CREATININE 7.35* 7.96* 6.85* 5.44*  CALCIUM 10.3 9.6 9.9 8.8*  PHOS 4.0 4.4 5.1* 3.4     Recent Labs Lab 03/05/16 1505 03/06/16 1628 03/07/16 1642  ALBUMIN 2.8* 2.8* 3.0*     Recent Labs Lab 03/02/16 1541 03/05/16 1505 03/06/16 1628 03/07/16 1642  WBC 7.2 5.1 5.3 6.2  HGB 7.9* 8.0* 7.9* 8.1*  HCT 25.7* 25.4* 25.7* 26.0*  MCV 88.9 88.5 89.9 88.1  PLT 194 164 162 181     Recent Labs Lab 03/07/16 0645 03/07/16 1131 03/07/16 2141 03/08/16 0634 03/08/16 1119  GLUCAP 77 105* 162* 101* 159*      Medications:  . allopurinol  100 mg Oral Daily  . atorvastatin  40 mg Oral q1800  . carvedilol  18.75 mg Oral BID WC  . cholecalciferol  1,000 Units Oral Daily  . cinacalcet  60 mg Oral Q supper  . darbepoetin (ARANESP) injection - DIALYSIS  100 mcg Intravenous Q Wed-HD  . famotidine  20 mg Oral Daily  . ferric gluconate (FERRLECIT/NULECIT) IV  62.5 mg Intravenous Q Wed-HD  . gentamicin ointment   Topical TID  . heparin  5,000 Units Subcutaneous Q8H  . insulin aspart  0-5 Units Subcutaneous QHS  . insulin aspart  0-9 Units Subcutaneous TID WC  . insulin glargine  10 Units Subcutaneous BID  . lidocaine  2 patch Transdermal Q24H  . modafinil  100 mg Oral Daily  . polyethylene glycol  17 g Oral BID  . pregabalin  75 mg Oral Daily  . sevelamer carbonate  1,600 mg Oral TID WC  .  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 [facility] found to have SDH and during admission had cardiac arrest 2/2 aspiration, MSSA PNA, CDiff.  Prev on PD (still has catheter), but currently getting HD via his backup AVF (last intervention 02/15/16 thrombolysis) Was prev at [facility] now in CIR for rehab.    Assessment/Recommendations  1. ESRD 1. PD as outpt up until admitted 2. Transitioned to HD TIW during admit using RUE AVF  3. Outpt nephrology is Dr. Kayren EavesErnest Johnson -- [facility] 4. Cont MWF HD Sessions via RUE AVF in PM: 2K, 2 Ca, No heparin, Qb 400.   EDW approx 102.5. For HD 11/15. 5. Arranged weekly PD catheter flushing with 6E nursing - done 11/11, only 400cc fluid returned so will need to keep an eye on that. - getting dressing changes.  Will arrange one more flush before d/c- to be done today 11/16 6. Pt wishes to return to PD when home for CIR- will need to see Dr. Laural BenesJohnson in clinic before transitioning back.  Wife not trained in  PD to help him per pt.  [facility].  Arranging for d/c to there. 7. 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 Stop phoslo, increase sevelemer, continue sensipar. Change to 2Ca bath with HD 4. Anemia of CKD and Chronic Illness -- on Darbe 100 QWed; last TSAT 22% and ferritin 599 (02/10/16). Add weekly iron dose. 5. HTN, stable 6. DM2   Bufford ButtnerElizabeth Khya Halls, MD [facility] pgr [phone] Cell [phone] 03/08/2016, 2:59 PM

## 2016-03-08 NOTE — Progress Notes (Signed)
[facility] PHYSICAL MEDICINE & REHABILITATION     PROGRESS NOTE    Subjective/Complaints: Feels stiff this morning.   ROS: Pt denies fever, rash/itching, headache, blurred or double vision, nausea, vomiting, abdominal pain, diarrhea, chest pain, shortness of breath, palpitations, dysuria, dizziness, neck pain, back pain, bleeding, anxiety, or depression      Objective: Vital Signs: Blood pressure 111/78, pulse 74, temperature 98.7 F (37.1 C), temperature source Oral, resp. rate 17, height 5\' 9"  (1.753 m), weight 104.3 kg (229 lb 15 oz), SpO2 94 %. No results found.  Recent Labs  03/06/16 1628 03/07/16 1642  WBC 5.3 6.2  HGB 7.9* 8.1*  HCT 25.7* 26.0*  PLT 162 181    Recent Labs  03/06/16 1628 03/07/16 1642  NA 139 136  K 4.3 3.7  CL 102 101  GLUCOSE 156* 100*  BUN 32* 26*  CREATININE 6.85* 5.44*  CALCIUM 9.9 8.8*   CBG (last 3)   Recent Labs  03/07/16 1131 03/07/16 2141 03/08/16 0634  GLUCAP 105* 162* 101*    Wt Readings from Last 3 Encounters:  03/07/16 104.3 kg (229 lb 15 oz)  02/28/16 115.2 kg (254 lb)    Physical Exam:  Constitutional: NAD.   HENT:  Head: Normocephalic and atraumatic.  Mouth/Throat: Oropharynx moist Eyes: PERRL Neck: Normal range of motion. supple.  Cardiovascular: Normal rate and regular rhythm. No JVD  Murmur present syst Respiratory: CTA.    GI: Soft. Bowel sounds are normal. He exhibits no distension.  Peritoneal dialysis catheter in place with dry dressing intact--no drainage Musculoskeletal: He exhibits no edema or tenderness. - edema, Left lower extremity without tenderness, no erythema, no swelling. Bilateral feet. Without edema or skin breakdown.  Neurological: He is alert and oriented to person, place, and time. No cranial nerve deficit. appropriate Motor: 5/5   throughout   prox to distal. ?tingling in feet Skin: Skin is warm and dry.avg intact Psychiatric:pleasant and cooperative  Assessment/Plan: 1.  Functional deficits secondary to TBI which require 3+ hours per day of interdisciplinary therapy in a comprehensive inpatient rehab setting. Physiatrist is providing close team supervision and 24 hour management of active medical problems listed below. Physiatrist and rehab team continue to assess barriers to discharge/monitor patient progress toward functional and medical goals.  Function:  Bathing Bathing position   Position: Shower  Bathing parts Body parts bathed by patient: Right arm, Left arm, Chest, Abdomen, Front perineal area, Buttocks, Right upper leg, Left upper leg, Right lower leg, Left lower leg, Back Body parts bathed by helper: Right lower leg, Left lower leg, Back  Bathing assist Assist Level: More than reasonable time, Assistive device      Upper Body Dressing/Undressing Upper body dressing   What is the patient wearing?: Pull over shirt/dress     Pull over shirt/dress - Perfomed by patient: Thread/unthread right sleeve, Thread/unthread left sleeve, Put head through opening, Pull shirt over trunk          Upper body assist Assist Level: No help, No cues      Lower Body Dressing/Undressing Lower body dressing   What is the patient wearing?: Underwear, Pants, Non-skid slipper socks, Shoes Underwear - Performed by patient: Thread/unthread right underwear leg, Thread/unthread left underwear leg, Pull underwear up/down   Pants- Performed by patient: Thread/unthread right pants leg, Thread/unthread left pants leg, Pull pants up/down Pants- Performed by helper: Thread/unthread left pants leg Non-skid slipper socks- Performed by patient: Don/doff right sock, Don/doff left sock Non-skid slipper socks- Performed by  helper: Don/doff right sock, Don/doff left sock     Shoes - Performed by patient: Don/doff right shoe, Don/doff left shoe, Fasten right, Fasten left            Lower body assist Assist for lower body dressing: Assistive device, More than reasonable  time      Toileting Toileting   Toileting steps completed by patient: Adjust clothing prior to toileting, Performs perineal hygiene, Adjust clothing after toileting Toileting steps completed by helper: Adjust clothing prior to toileting, Performs perineal hygiene, Adjust clothing after toileting Toileting Assistive Devices: Grab bar or rail  Toileting assist Assist level: More than reasonable time   Transfers Chair/bed transfer   Chair/bed transfer method: Ambulatory Chair/bed transfer assist level: Supervision or verbal cues Chair/bed transfer assistive device: Patent attorneyWalker     Locomotion Ambulation     Max distance: 150 ft Assist level: Supervision or verbal cues   Wheelchair     Max wheelchair distance: 200 Assist Level: Supervision or verbal cues  Cognition Comprehension Comprehension assist level: Follows complex conversation/direction with no assist  Expression Expression assist level: Expresses complex ideas: With no assist  Social Interaction Social Interaction assist level: Interacts appropriately with others - No medications needed.  Problem Solving Problem solving assist level: Solves complex problems: Recognizes & self-corrects  Memory Memory assist level: Complete Independence: No helper   Medical Problem List and Plan: 1.  Weakness, mobility deficits, difficulty completing ADLs secondary to TBI and multimedical.  -continue inpatient therapies, PT, OT, speech  -home tomorrow 2.  DVT Prophylaxis/Anticoagulation: lovenox 3. Pain Management:  -continue lyrica for neuropathic pain  -doing well with oxycodone 5-10mg  q8 prn --using rarely 4. Mood: Motivated to get better and get home. LCSW to follow for evaluation and support.  5. Neuropsych: This patient is capable of making decisions on his own behalf. 6. Skin/Wound Care: Routine pressure relief measures.  7. Fluids/Electrolytes/Nutrition:  Strict I/O. Check daily weights. 1200 cc FR/day.   -labs  with HD MWF 8. T2DM  with nerupathy:   Continue lantus bid with SSI for elevated BS.    -will reduce lantus to 10u bid and observe 9. OSA: CPAP, weaning oxygen. 10. ESRD:  HD MWF later in day to help with therapy completion.   -Nephrology following  -pt states he was doing PD prior to admit. Rinaldo Cloudamela Love[facility] proved otherwise. Continue HD upon discharge home---he will discuss with Dr. Laural BenesJohnson options as outpt 11. Gout: controlled on allopurinol.   12. Anemia of chronic disease: Monitor serially and transfuse if needed.  -continue aranesp weekly as well as iron for supplement.   -hgb around 8.0 13. HTN: normotensive 14. Morbid Obesity:               LOS (Days) 8 A FACE TO FACE EVALUATION WAS PERFORMED  Dangela How T 03/08/2016 9:27 AM

## 2016-03-09 LAB — RENAL FUNCTION PANEL
ALBUMIN: 3.1 g/dL — AB (ref 3.5–5.0)
Anion gap: 13 (ref 5–15)
BUN: 36 mg/dL — AB (ref 6–20)
CO2: 23 mmol/L (ref 22–32)
CREATININE: 7.72 mg/dL — AB (ref 0.61–1.24)
Calcium: 9.4 mg/dL (ref 8.9–10.3)
Chloride: 101 mmol/L (ref 101–111)
GFR calc Af Amer: 8 mL/min — ABNORMAL LOW (ref >60)
GFR, EST NON AFRICAN AMERICAN: 7 mL/min — AB (ref >60)
Glucose, Bld: 122 mg/dL — ABNORMAL HIGH (ref 65–99)
PHOSPHORUS: 5.7 mg/dL — AB (ref 2.5–4.6)
POTASSIUM: 4.1 mmol/L (ref 3.5–5.1)
Sodium: 137 mmol/L (ref 135–145)

## 2016-03-09 LAB — CBC
HEMATOCRIT: 28.5 % — AB (ref 39.0–52.0)
Hemoglobin: 8.9 g/dL — ABNORMAL LOW (ref 13.0–17.0)
MCH: 27.6 pg (ref 26.0–34.0)
MCHC: 31.2 g/dL (ref 30.0–36.0)
MCV: 88.5 fL (ref 78.0–100.0)
PLATELETS: 175 10*3/uL (ref 150–400)
RBC: 3.22 MIL/uL — ABNORMAL LOW (ref 4.22–5.81)
RDW: 15.7 % — AB (ref 11.5–15.5)
WBC: 5.8 10*3/uL (ref 4.0–10.5)

## 2016-03-09 LAB — GLUCOSE, CAPILLARY: GLUCOSE-CAPILLARY: 80 mg/dL (ref 65–99)

## 2016-03-09 MED ORDER — OXYCODONE HCL 5 MG PO TABS
ORAL_TABLET | ORAL | Status: AC
  Filled 2016-03-09: qty 1

## 2016-03-09 MED ORDER — GENTAMICIN SULFATE 0.1 % EX CREA: 1.0000 "application " | TOPICAL_CREAM | Freq: Three times a day (TID) | CUTANEOUS | 0 refills | Status: AC

## 2016-03-09 NOTE — Procedures (Signed)
Patient seen and examined on Hemodialysis. QB 400, UF goal 2.5L. Going to d/c today.  Very excited Treatment adjusted as needed.  Bufford ButtnerElizabeth Hetty Linhart MD [facility]. Cell [phone] pgr [phone] 1:10 PM

## 2016-03-09 NOTE — Progress Notes (Signed)
Patient refusing CPAP tonight. Stated he did not like the mask or pressures. RT made pt aware that if he changed his mind to call.

## 2016-03-09 NOTE — Progress Notes (Signed)
Social Work  Discharge Note  The overall goal for the admission was met for:   Discharge location: Yes - home with wife who can provide intermittent assistance  Length of Stay: Yes - 9 days  Discharge activity level: Yes - modified independent  Home/community participation: Yes  Services provided included: MD, RD, PT, OT, SLP, RN, TR, Pharmacy and [place]: Medicare and Private Insurance: [place]  Follow-up services arranged: Home Health: RN, PT, OT via [facility], DME: rolling walker, 3n1 commode and tub bench via [place] and Patient/Family has no preference for HH/DME agencies  Comments (or additional information):  Patient/Family verbalized understanding of follow-up arrangements: Yes  Individual responsible for coordination of the follow-up plan: pt  Confirmed correct DME delivered: Miroslav Gin 03/09/2016    [place], [place]

## 2016-03-09 NOTE — Progress Notes (Signed)
Patient received discharge instructions from Marissa NestlePam Love, PA-C with verbal understanding. Patient discharged to home with prescriptions, equipment, and belongings.

## 2016-03-09 NOTE — Discharge Summary (Signed)
Physician Discharge Summary  Patient ID: [name] [name] MRN: [id number] DOB/AGE: 21-Aug-1960 55 y.o.  Admit date: 02/29/2016 Discharge date: 03/09/2016  Discharge Diagnoses:  Principal Problem:   Traumatic subdural hematoma with loss of consciousness of 31 minutes to 59 minutes (HCC) Active Problems:   History of cardiac arrest   Type 2 diabetes mellitus with peripheral neuropathy (HCC)   ESRD on dialysis (HCC)   OSA (obstructive sleep apnea)   History of gout   Anemia of chronic disease   Benign essential HTN   Morbid obesity (HCC)   Discharged Condition:  stable  Significant Diagnostic Studies: N/A   Labs:  Basic Metabolic Panel:  Recent Labs Lab 03/05/16 1505 03/06/16 1628 03/07/16 1642 03/09/16 0659  NA 139 139 136 137  K 4.4 4.3 3.7 4.1  CL 104 102 101 101  CO2 24 28 25 23   GLUCOSE 107* 156* 100* 122*  BUN 43* 32* 26* 36*  CREATININE 7.96* 6.85* 5.44* 7.72*  CALCIUM 9.6 9.9 8.8* 9.4  PHOS 4.4 5.1* 3.4 5.7*    CBC:  Recent Labs Lab 03/06/16 1628 03/07/16 1642 03/09/16 0659  WBC 5.3 6.2 5.8  HGB 7.9* 8.1* 8.9*  HCT 25.7* 26.0* 28.5*  MCV 89.9 88.1 88.5  PLT 162 181 175    CBG:  Recent Labs Lab 03/08/16 0634 03/08/16 1119 03/08/16 1703 03/08/16 2138 03/09/16 1116  GLUCAP 101* 159* 86 197* 80    Brief HPI:  [name] is a 55 year old male with history of T2DM with neuropathy and nephropathy, polycystic kidney, ESRD-HD dependent, gout, HTN, chronic low back pain who was originally admitted [facility] 01/20/16 with uncontrollable shaking after reports of fall with head trauma on 9/19. History taken from chart review. CT head done showing acute SDH along flax without compression. Hospital course complicated by cardiac arrest 10/1 due to aspiration event and required CPR with ROSC and intubated thorough 10/12.  Respiratory status stable post extubation but with complaint of rib, sternal and right shoulder pain. He completed  treatment for MSSA aspiration PNA but was noted to be encephalopathic. `MRI brain and EEG showed not abnormality.  [facility] medical management and therapy. He developed C diff colitis on 10/20 and has completed treatment.  R-AVF thrombolysis performed on 10/25 with return of flow and HD ongoing. His endurance levels were improving and CIR was consulted for intensive rehab program. Records reviewed and he was felt to be a good rehab candidate     Hospital Course: [name] BasemanLonnie L Gouin was admitted to rehab 02/29/2016 for inpatient therapies to consist of PT and OT at least three hours five days a week. Past admission physiatrist, therapy team and rehab RN have worked together to provide customized collaborative inpatient rehab. His endurance has improved and he was weaned off oxygen. Anxiety has improved and he is tolerating HD. His PD catheter has been flushed X 2 --last on 11/17.  Lyrica was added to help with neuropathic symptoms Torsemide was discontinued by nephrology as was not needed. His blood pressure have been stable and intake has been good.    Anemia of chronic disease has been monitored with serial CBC and aranesp was used weekly in addition to iron dose for supplementation. Diabetes has been monitored with ac/hs checks and lantus was decreased to 10 units bid to prevent hypoglycemic episodes  He has been educated on monitoring BS closely at home and to contact MD for titration if BS start  trending over 150. He has been non-compliant with CPAP due to reports of discomfort with mask use during his stay and has been educated on importance of consistent use.  Gout has been controlled on allopurinol. He has made steady progress and is modified independent in supervised setting. He will continue to receive follow up HHPT and HHOT by  [facility] after discharge.    Rehab course: During patient's stay in rehab weekly team conferences were held to monitor patient's  progress, set goals and discuss barriers to discharge. At admission, patient required moderate assistance with ADL tasks and moderate assistance with  mobility. Cognition was noted to be WNL with MoCA score 29/30 therefore no ST needed per evaluation. He has had improvement in activity tolerance, balance, postural control, as well as ability to compensate for deficits. He is able to complete ADL tasks independently. He is modified independent for transfers and mobility . He is ambulating 400' with RW. He requires supervision to climb 12 stairs. Family education was completed with wife regarding all aspects of safety as well as HEP to help improve endurance.     Disposition: 01-Home or Self Care  Diet: Renal diet/Carb Modified Medium.  Special Instructions: 1. Check blood sugars 2-3 times a day and record. Increase lantus by 5 units  if BS start trending above 150 in  24 hour period. 2. No driving or strenuous activity till cleared by MD. 3. No PD at this time. Follow up with Dr. Laural Benes to transition back to PD.    Discharge Instructions    Ambulatory referral to Physical Medicine Rehab    Complete by:  As directed    1-2weeks for follow up       Medication List    STOP taking these medications   calcium acetate 667 MG capsule Commonly known as:  PHOSLO   Darbepoetin Alfa 100 MCG/0.5ML Sosy injection Commonly known as:  ARANESP   doxazosin 4 MG tablet Commonly known as:  CARDURA   ferrous sulfate 325 (65 FE) MG tablet   Fish Oil 1000 MG Caps   gabapentin 100 MG capsule Commonly known as:  NEURONTIN   senna-docusate 8.6-50 MG tablet Commonly known as:  Senokot-S   torsemide 20 MG tablet Commonly known as:  DEMADEX     TAKE these medications   allopurinol 100 MG tablet Commonly known as:  ZYLOPRIM Take 100 mg by mouth daily.   atorvastatin 40 MG tablet Commonly known as:  LIPITOR Take 40 mg by mouth daily at 6 PM.   b complex-vitamin c-folic acid 0.8 MG Tabs  tablet Take 1 tablet by mouth at bedtime.   carvedilol 12.5 MG tablet Commonly known as:  COREG Take 1.5 tablets (18.75 mg total) by mouth 2 (two) times daily with a meal.   cinacalcet 30 MG tablet Commonly known as:  SENSIPAR Take 2 tablets (60 mg total) by mouth daily with supper. What changed:  when to take this   famotidine 20 MG tablet Commonly known as:  PEPCID Take 1 tablet (20 mg total) by mouth daily.   gentamicin cream 0.1 % Commonly known as:  GARAMYCIN Apply 1 application topically 3 (three) times daily. To PD site   insulin glargine 100 UNIT/ML injection Commonly known as:  LANTUS Inject 0.1 mLs (10 Units total) into the skin 2 (two) times daily. What changed:  how much to take   insulin lispro 100 UNIT/ML injection Commonly known as:  HUMALOG Inject 0-16 Units into the skin 3 (  three) times daily with meals. 70-150 = 0 units 151-200 = 4 units 210-250 = 8 units 251-300 = 10 units 310-350 = 12 units 351-400 = 16 units   modafinil 100 MG tablet--Rx # 30 pills  Commonly known as:  PROVIGIL Take 1 tablet (100 mg total) by mouth daily.   oxyCODONE 5 MG immediate release tablet--Rx # 5 pills  Commonly known as:  Oxy IR/ROXICODONE Take 1 tablet (5 mg total) by mouth daily as needed for severe pain. What changed:  when to take this  reasons to take this   pantoprazole 40 MG tablet Commonly known as:  PROTONIX Take 40 mg by mouth daily.   polyethylene glycol packet Commonly known as:  MIRALAX / GLYCOLAX Take 17 g by mouth 2 (two) times daily.   pregabalin 75 MG capsule--Rx #30 pills Commonly known as:  LYRICA Take 1 capsule (75 mg total) by mouth daily.   sevelamer carbonate 800 MG tablet Commonly known as:  RENVELA Take 2 tablets (1,600 mg total) by mouth 3 (three) times daily with meals. What changed:  how much to take   tamsulosin 0.4 MG Caps capsule Commonly known as:  FLOMAX Take 1 capsule (0.4 mg total) by mouth daily.   VITAMIN D-1000 MAX ST  1000 units tablet Generic drug:  Cholecalciferol Take by mouth.      Follow-up Information    Ranelle OysterSWARTZ,ZACHARY T, MD Follow up.   Specialty:  Physical Medicine and Rehabilitation Why:  office will call you with follow up appointment Contact information: [address] [place] [address] [phone]        Remonia RichterPRICE, JOHN N, MD. Call in 1 day(s).   Specialty:  Family Medicine Why:  for follow up appointment  Contact information: [address] [place] [id number] [phone]           Signed: Jacquelynn CreeLove, Pamela S 03/11/2016, 10:23 AM

## 2016-03-09 NOTE — Discharge Instructions (Signed)
Inpatient Rehab Discharge Instructions  [name] BasemanLonnie L [name] Discharge date and time:  03/09/16  Activities/Precautions/ Functional Status: Activity: No lifting, driving, or strenuous exercise  till cleared by MD. Diet: renal diet 1200 cc fluid daily.  Wound Care: keep wound clean and dry   Functional status:  ___ No restrictions     ___ Walk up steps independently ___ 24/7 supervision/assistance   ___ Walk up steps with assistance _X__ Intermittent supervision/assistance  _X__ Bathe/dress independently ___ Walk with walker     ___ Bathe/dress with assistance ___ Walk Independently    ___ Shower independently ___ Walk with assistance    _X__ Shower transfers with supervision.  _X__ No alcohol     ___ Return to work/school ________    COMMUNITY REFERRALS UPON DISCHARGE:    Home Health:   PT     OT     RN                 Agency:  [facility] Phone:  [phone]   Medical Equipment/Items Ordered: rolling walker, tub bench and bedside commode                                                     Agency/Supplier:  [facility] @ [phone]  DIALYSIS SCHEDULE:  Next week only:  Sun 11/19 @ 6:30 am Wed 11/22 @ 11:30 am Sat   11/25 @ 11:00   Weeks following:  Tues, Thursday and Saturdays @ 11:00 am   Special Instructions: 1. You are going to be doing hemodialysis after discharge. You have not been cleared to start PD till you follow up with Dr. Laural BenesJohnson.  2. You need to wear CPAP at nights or when napping.    My questions have been answered and I understand these instructions. I will adhere to these goals and the provided educational materials after my discharge from the hospital.  Patient/Caregiver Signature _______________________________ Date __________  Clinician Signature _______________________________________ Date __________  Please bring this form and your medication list with you to all your follow-up doctor's appointments.

## 2016-03-09 NOTE — Progress Notes (Signed)
[facility] Rounding Note  Subjective/Interval History:   Feels well.  HD today, then d/c.  PD catheter flushed last night.  Per pt, it went well.  Objective Vital signs in last 24 hours: Vitals:   03/09/16 0928 03/09/16 1000 03/09/16 1037 03/09/16 1119  BP: (!) 105/48 108/62 110/62 108/65  Pulse: 81 78 78 98  Resp:   17 16  Temp:   98.1 F (36.7 C) 98.6 F (37 C)  TempSrc:   Oral Oral  SpO2:   97% 98%  Weight:      Height:       Weight change: -4.2 kg (-9 lb 4.2 oz)  Intake/Output Summary (Last 24 hours) at 03/09/16 1304 Last data filed at 03/09/16 1037  Gross per 24 hour  Intake              240 ml  Output             2400 ml  Net            -2160 ml   Physical Exam:  Blood pressure 108/65, pulse 98, temperature 98.6 F (37 C), temperature source Oral, resp. rate 16, height 5\' 9"  (1.753 m), weight 103.4 kg (227 lb 15.3 oz), SpO2 98 %.  GEN: well appearing  HEENT: EOMI, PERRL, no JVD Cv: RRR LUNGS: clear ABD: PD cath in place RLQ with dressing loose today EXT: 1+ LE edema, L > R ACCESS: RUE AVF good bruit and thrill  Labs:  Recent Labs Lab 03/02/16 1540 03/05/16 1505 03/06/16 1628 03/07/16 1642 03/09/16 0659  NA 139 139 139 136 137  K 4.7 4.4 4.3 3.7 4.1  CL 102 104 102 101 101  CO2 28 24 28 25 23   GLUCOSE 107* 107* 156* 100* 122*  BUN 40* 43* 32* 26* 36*  CREATININE 7.35* 7.96* 6.85* 5.44* 7.72*  CALCIUM 10.3 9.6 9.9 8.8* 9.4  PHOS 4.0 4.4 5.1* 3.4 5.7*     Recent Labs Lab 03/06/16 1628 03/07/16 1642 03/09/16 0659  ALBUMIN 2.8* 3.0* 3.1*     Recent Labs Lab 03/05/16 1505 03/06/16 1628 03/07/16 1642 03/09/16 0659  WBC 5.1 5.3 6.2 5.8  HGB 8.0* 7.9* 8.1* 8.9*  HCT 25.4* 25.7* 26.0* 28.5*  MCV 88.5 89.9 88.1 88.5  PLT 164 162 181 175     Recent Labs Lab 03/08/16 0634 03/08/16 1119 03/08/16 1703 03/08/16 2138 03/09/16 1116  GLUCAP 101* 159* 86 197* 80     Medications:  . allopurinol  100 mg Oral Daily  . atorvastatin  40 mg Oral  q1800  . carvedilol  18.75 mg Oral BID WC  . cholecalciferol  1,000 Units Oral Daily  . cinacalcet  60 mg Oral Q supper  . darbepoetin (ARANESP) injection - DIALYSIS  100 mcg Intravenous Q Wed-HD  . famotidine  20 mg Oral Daily  . ferric gluconate (FERRLECIT/NULECIT) IV  62.5 mg Intravenous Q Wed-HD  . gentamicin ointment   Topical TID  . heparin  5,000 Units Subcutaneous Q8H  . insulin aspart  0-5 Units Subcutaneous QHS  . insulin aspart  0-9 Units Subcutaneous TID WC  . insulin glargine  10 Units Subcutaneous BID  . lidocaine  2 patch Transdermal Q24H  . modafinil  100 mg Oral Daily  . oxyCODONE      . polyethylene glycol  17 g Oral BID  . pregabalin  75 mg Oral Daily  . sevelamer carbonate  1,600 mg Oral TID WC  . 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  - [facility] 9/19 found to have SDH and during admission had cardiac arrest 2/2 aspiration, MSSA PNA, CDiff.  Prev on PD (still has catheter), but currently getting HD via his backup AVF (last intervention 02/15/16 thrombolysis) Was prev at [facility] now in CIR for rehab.    Assessment/Recommendations  1. ESRD 1. PD as outpt up until admitted 2. Transitioned to HD TIW during admit using RUE AVF  3. Outpt nephrology is Dr. Kayren EavesErnest Johnson -- [place] 4. Cont MWF HD Sessions via RUE AVF in PM: 2K, 2 Ca, No heparin, Qb 400.   EDW approx 102.5. For HD 11/17 5. Arranged weekly PD catheter flushing with 6E nursing - done 11/11, only 400cc fluid returned so will need to keep an eye on that. - getting dressing changes.  PD cath flushed on 11/17 without incident.  Pt wishes to return to PD when home for CIR- will need to see Dr. Laural BenesJohnson in clinic before transitioning back.  Wife not trained in PD to help him per pt.  [facility].  Arranging for d/c to  there. 6. 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 Stopped phoslo, sevelemer 1600 TID, continue sensipar 60 mg daily.  Change to 2Ca bath with HD 4. Anemia of CKD and Chronic Illness -- on Darbe 100 QWed; last TSAT 22% and ferritin 599 (02/10/16). On weekly iron dose.  Hgb increasing to 8.9.   5. HTN, stable 6. DM2   [name] ButtnerElizabeth Abbigail Anstey, MD [facility] pgr [phone] Cell [phone] 03/09/2016, 1:04 PM

## 2016-03-12 ENCOUNTER — Telehealth: Payer: Self-pay | Admitting: *Deleted

## 2016-03-12 NOTE — Telephone Encounter (Signed)
Transitional care call completed, appointment changed to 03/21/2016 at 2:00pm  Transitional Care Questions  Questions for our staff to ask patients on Transitional care 48 hour phone call:   1. Are you/is patient experiencing any problems since coming home? Dizziness Are there any questions regarding any aspect of care? No  2. Are there any questions regarding medications administration/dosing? Patient was taken off of lisinopril, pt is concerned Are meds being taken as prescribed? yes Patient should review meds with caller to confirm   3. Have there been any falls?  No  4. Has Home Health been to the house and/or have they contacted you? No, they are supposed to be calling If not, have you tried to contact them? Can we help you contact them? No   5. Are bowels and bladder emptying properly? Yes Are there any unexpected incontinence issues? If applicable, is patient following bowel/bladder programs?  6. Any fevers, problems with breathing, unexpected pain? No   7. Are there any skin problems or new areas of breakdown? No  8. Has the patient/family member arranged specialty MD follow up (ie cardiology/neurology/renal/surgical/etc)? Patient is to follow up with Dr. Riley KillSwartz and his PCP. Can we help arrange?   9. Does the patient need any other services or support that we can help arrange? No  10. Are caregivers following through as expected in assisting the patient? Yes   11. Has the patient quit smoking, drinking alcohol, or using drugs as recommended? Patient is not smoking, drinking, or using illicit drugs

## 2016-03-14 ENCOUNTER — Encounter: Payer: Medicare Other | Admitting: Physical Medicine & Rehabilitation

## 2016-03-21 ENCOUNTER — Encounter (INDEPENDENT_AMBULATORY_CARE_PROVIDER_SITE_OTHER): Payer: Self-pay

## 2016-03-21 ENCOUNTER — Encounter: Payer: Medicare Other | Attending: Physical Medicine & Rehabilitation | Admitting: Physical Medicine & Rehabilitation

## 2016-03-21 ENCOUNTER — Encounter: Payer: Self-pay | Admitting: Physical Medicine & Rehabilitation

## 2016-03-21 DIAGNOSIS — E1122 Type 2 diabetes mellitus with diabetic chronic kidney disease: Secondary | ICD-10-CM | POA: Diagnosis not present

## 2016-03-21 DIAGNOSIS — N281 Cyst of kidney, acquired: Secondary | ICD-10-CM | POA: Insufficient documentation

## 2016-03-21 DIAGNOSIS — Z833 Family history of diabetes mellitus: Secondary | ICD-10-CM | POA: Insufficient documentation

## 2016-03-21 DIAGNOSIS — Z841 Family history of disorders of kidney and ureter: Secondary | ICD-10-CM | POA: Diagnosis not present

## 2016-03-21 DIAGNOSIS — M179 Osteoarthritis of knee, unspecified: Secondary | ICD-10-CM | POA: Insufficient documentation

## 2016-03-21 DIAGNOSIS — N186 End stage renal disease: Secondary | ICD-10-CM

## 2016-03-21 DIAGNOSIS — R531 Weakness: Secondary | ICD-10-CM | POA: Insufficient documentation

## 2016-03-21 DIAGNOSIS — Z79891 Long term (current) use of opiate analgesic: Secondary | ICD-10-CM | POA: Diagnosis not present

## 2016-03-21 DIAGNOSIS — Z992 Dependence on renal dialysis: Secondary | ICD-10-CM

## 2016-03-21 DIAGNOSIS — Z87442 Personal history of urinary calculi: Secondary | ICD-10-CM | POA: Diagnosis not present

## 2016-03-21 DIAGNOSIS — G8929 Other chronic pain: Secondary | ICD-10-CM | POA: Diagnosis not present

## 2016-03-21 DIAGNOSIS — M5136 Other intervertebral disc degeneration, lumbar region: Secondary | ICD-10-CM | POA: Insufficient documentation

## 2016-03-21 DIAGNOSIS — E1142 Type 2 diabetes mellitus with diabetic polyneuropathy: Secondary | ICD-10-CM

## 2016-03-21 DIAGNOSIS — G629 Polyneuropathy, unspecified: Secondary | ICD-10-CM | POA: Diagnosis not present

## 2016-03-21 DIAGNOSIS — E785 Hyperlipidemia, unspecified: Secondary | ICD-10-CM | POA: Diagnosis not present

## 2016-03-21 DIAGNOSIS — G4733 Obstructive sleep apnea (adult) (pediatric): Secondary | ICD-10-CM | POA: Diagnosis not present

## 2016-03-21 DIAGNOSIS — S065X2S Traumatic subdural hemorrhage with loss of consciousness of 31 minutes to 59 minutes, sequela: Secondary | ICD-10-CM

## 2016-03-21 DIAGNOSIS — M109 Gout, unspecified: Secondary | ICD-10-CM | POA: Diagnosis not present

## 2016-03-21 DIAGNOSIS — Z87820 Personal history of traumatic brain injury: Secondary | ICD-10-CM | POA: Diagnosis not present

## 2016-03-21 NOTE — Progress Notes (Signed)
Subjective:    Patient ID: [name] [name], male    DOB: 03/26/1961, 55 y.o.   MRN: [id number]      TRANSITIONAL CARE VISIT HPI   [name] is here in follow up of his TBI and associated deficits. He has been home for about a week from inpatient rehab. He is getting rides to HD which he'll continue until at least January. He has been home walking without his walker. He has been out walking at the stores shopping and using a buggy to help with support. He hasn't had any falls. He denies pain except from his spine which has been manageable. He uses oxycodone occasionally at night--his pain clinic rx'es this. Sleep has been fair. Sugars appear tightly controlled. His feet are about the same. He remains on lyrica.   He would like to return to driving especially to HD. Therapy has been coming out to the house. They will be out for about another week.       Pain Inventory Average Pain 0 Pain Right Now 0 My pain is .  In the last 24 hours, has pain interfered with the following? General activity 0 Relation with others 0 Enjoyment of life 0 What TIME of day is your pain at its worst? evening Sleep (in general) Good  Pain is worse with: walking and bending Pain improves with: medication Relief from Meds: 7  Mobility walk without assistance how many minutes can you walk? 30 ability to climb steps?  yes do you drive?  yes  Function disabled: date disabled .  Neuro/Psych tingling  Prior Studies Any changes since last visit?  no  Physicians involved in your care Any changes since last visit?  no   Family History  Problem Relation Age of Onset  . Polycystic kidney disease Father   . Polycystic kidney disease Brother   . Diabetes Mother   . Polycystic kidney disease Sister    Social History   Social History  . Marital status: Married    Spouse name: N/A  . Number of children: N/A  . Years of education: N/A   Social History Main Topics  . Smoking status: Not  on file  . Smokeless tobacco: Not on file  . Alcohol use Not on file  . Drug use: Unknown  . Sexual activity: Not on file   Other Topics Concern  . Not on file   Social History Narrative  . No narrative on file   Past Surgical History:  Procedure Laterality Date  . PERIPHERAL VASCULAR CATHETERIZATION Right 02/15/2016   Procedure: Fistulagram;  Surgeon: Maeola HarmanBrandon Christopher Cain, MD;  [facility];  Service: Cardiovascular;  Laterality: Right;  upper arm  . PERIPHERAL VASCULAR CATHETERIZATION Right 02/15/2016   Procedure: Peripheral Vascular Balloon Angioplasty;  Surgeon: Maeola HarmanBrandon Christopher Cain, MD;  [facility];  Service: Cardiovascular;  Laterality: Right;  upper arm venous   Past Medical History:  Diagnosis Date  . Chronic back pain   . Chronic pain of lower extremity, bilateral   . DDD (degenerative disc disease), lumbar   . Diabetes (HCC)   . ESRD on dialysis (HCC)   . Gout   . Hyperlipidemia   . Neuropathy (HCC)   . OA (osteoarthritis) of knee   . OSA on CPAP   . Peritonitis (HCC)   . Polycystic kidney   . Uric acid kidney stone    There were no vitals taken for this visit.  Opioid Risk Score:  Fall Risk Score:  `1  Depression screen PHQ 2/9  No flowsheet data found. Review of Systems  Constitutional: Negative.   HENT: Negative.   Eyes: Negative.   Respiratory: Negative.   Cardiovascular: Negative.   Gastrointestinal: Negative.   Endocrine: Negative.   Genitourinary: Negative.   Musculoskeletal: Negative.   Skin: Negative.   Allergic/Immunologic: Negative.   Neurological: Negative.        Tingling  Hematological: Negative.   Psychiatric/Behavioral: Negative.   All other systems reviewed and are negative.      Objective:   Physical Exam  Constitutional: NAD.   HENT:  Head: Normocephalic and atraumatic.  Mouth/Throat: edentulous Eyes: PERRL Neck: Normal range of motion. supple.  Cardiovascular: RRR, no  JVD Respiratory: CTA.    GI: Peritoneal dialysis catheter in place with dry dressing intact--no drainage Musculoskeletal: NO edema. Mild low back pain with ROM.  Neurological: He is alert and oriented to person, place, and time. No cranial nerve deficit. appropriate Motor: 5/5   in all 4. Good standing balance. Tandem gait intact. . Mild sensory loss in feet.  Skin: skin intact Psychiatric:pleasant and calm       Assessment & Plan:  Medical Problem List and Plan: 1. Weakness, mobility deficits, difficulty completing ADLs secondary to TBI and multimedical.             -continue HH PT---advance to gym/HEP  -may return to driving after following my driving protocol 2. Pain Management:             -continue lyrica for neuropathic pain             -oxycodone prn for LBP 3. ESRD:  HD  4. Arousal/concentration--may dc provigil   Thirty minutes of face to face patient care time were spent during this visit. All questions were encouraged and answered. Follow up with me PRN

## 2016-03-21 NOTE — Patient Instructions (Addendum)
YOU MAY STOP THE PROVIGIL.   PLEASE CALL ME WITH ANY PROBLEMS OR QUESTIONS [phone])   HAPPY HOLIDAYS!!!!                    *                * *             *   *   *         *  *   *  *  *     *  *  *  *  *  *  * *  *  *  *  *  *  *  *  *  * *               *  *               *  *               *  *  RETURN TO DRIVING PLAN:  WITH THE SUPERVISION OF A LICENSED DRIVER, PLEASE DRIVE IN AN EMPTY PARKING LOT FOR AT LEAST 1-2  TRIALS TO TEST REACTION TIME, VISION, USE OF EQUIPMENT IN CAR, ETC.  IF SUCCESSFUL WITH THE PARKING LOT DRIVING, PROCEED TO SUPERVISED DRIVING TRIALS IN YOUR NEIGHBORHOOD STREETS AT LOW TRAFFIC TIMES TO TEST OBSERVATION TO TRAFFIC SIGNALS, REACTION TIME, ETC. PLEASE ATTEMPT AT LEAST 1-2 TRIALS IN YOUR NEIGHBORHOOD.  IF NEIGHBORHOOD DRIVING IS SUCCESSFUL, YOU MAY PROCEED TO DRIVING IN BUSIER AREAS IN YOUR COMMUNITY WITH SUPERVISION OF A LICENSED DRIVER. PLEASE ATTEMPT AT LEAST 1-2 TRIALS.  IF COMMUNITY DRIVING IS SUCCESSFUL, YOU MAY PROCEED TO DRIVING ALONE, DURING THE DAY TIME, IN NON-PEAK TRAFFIC TIMES. YOU SHOULD DRIVE NO FURTHER THAN 25 MINUTES IN ONE DIRECTION. PLEASE DO NOT DRIVE IF YOU FEEL FATIGUED OR UNDER THE INFLUENCE OF MEDICATION.

## 2017-03-10 IMAGING — CR DG ABDOMEN 1V
1 series · 1 of 1 positions shown · non-contrast
Comparison: None.

CLINICAL DATA: Nasogastric tube placement. Respiratory failure.
Initial encounter.

EXAM:
ABDOMEN - 1 VIEW

[AP]
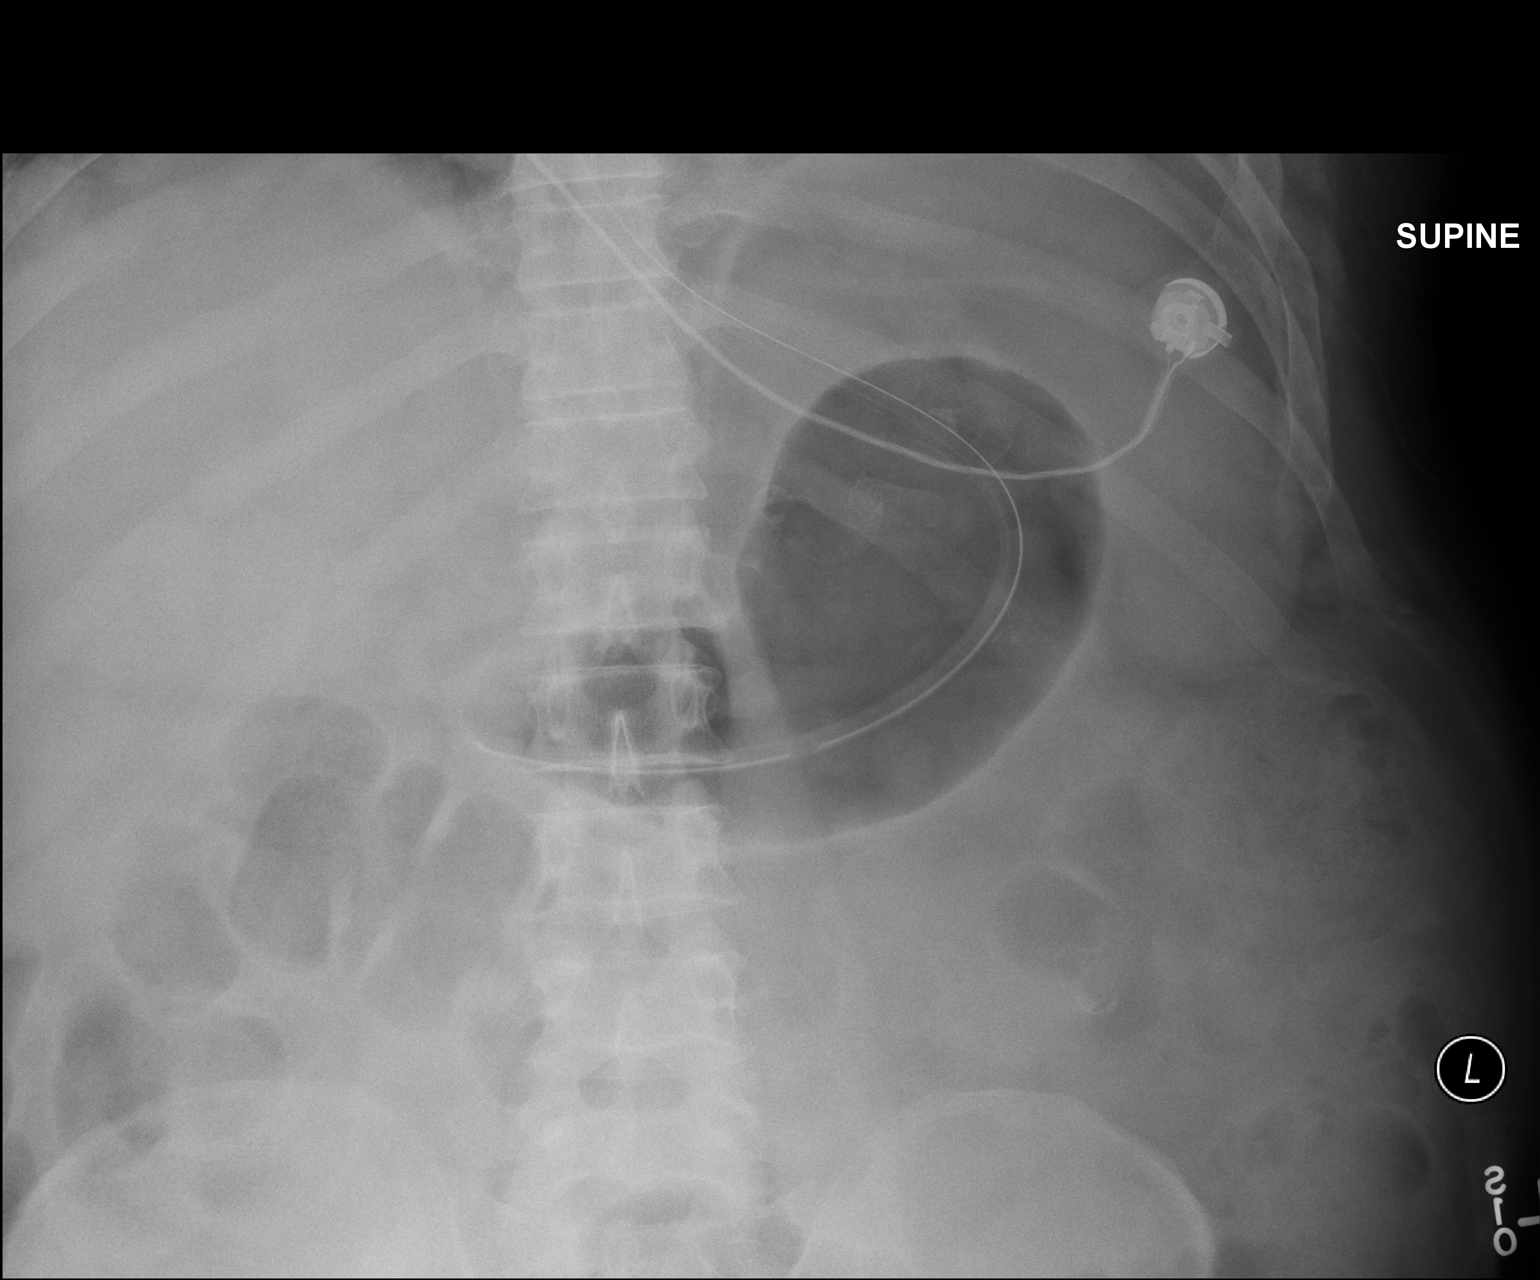

[1 of 1 positions shown; findings below may reference images not displayed]

FINDINGS: The patient's enteric tube is noted ending overlying the antrum of
the stomach.

The stomach is partially filled with air. The visualized bowel gas
pattern is unremarkable. Scattered air and stool filled loops of
colon are seen; no abnormal dilatation of small bowel loops is seen
to suggest small bowel obstruction. No free intra-abdominal air is
identified, though evaluation for free air is limited on a single
supine view.

The visualized osseous structures are within normal limits; the
sacroiliac joints are unremarkable in appearance.
IMPRESSION: Enteric tube noted ending overlying the antrum of the stomach.

## 2017-03-10 IMAGING — CR DG CHEST 1V PORT
1 series · 1 of 1 positions shown · non-contrast
Comparison: None.

CLINICAL DATA: Nasogastric placement.

EXAM:
PORTABLE CHEST 1 VIEW

[AP]
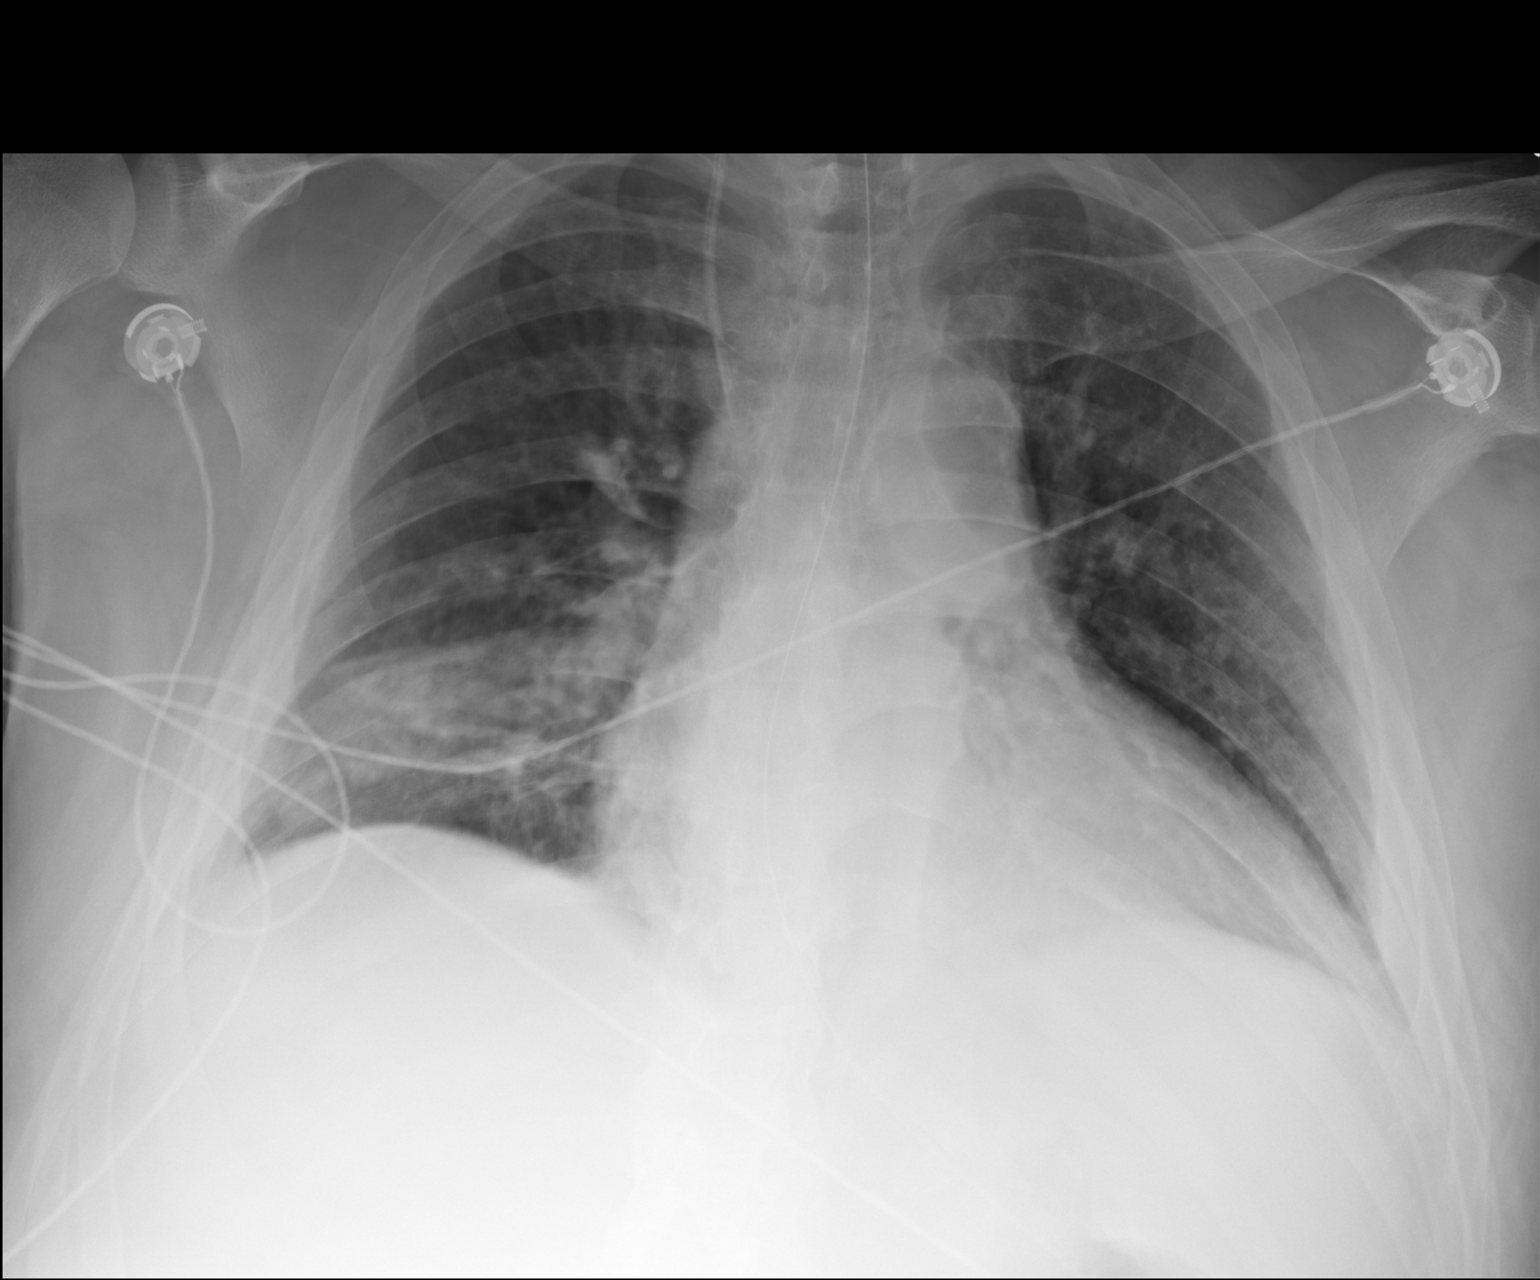

[1 of 1 positions shown; findings below may reference images not displayed]

FINDINGS: Nasogastric tube enters the abdomen. Right internal jugular central
line has its tip in the SVC at the azygos level. No pneumothorax.
There is pulmonary infiltrate at the lung bases right more than left
consistent with pneumonia. No effusion.
IMPRESSION: Lower lung pneumonia right more than left.

Nasogastric tube enters the abdomen. Right IJ central line in the
SVC at the azygos level.

## 2021-03-23 DEATH — deceased
